# Patient Record
Sex: Female | Born: 1965 | Race: Black or African American | Hispanic: No | Marital: Married | Smoking: Never smoker
Health system: Southern US, Community
[De-identification: ages and names within clinical notes are randomized; demographics above are authoritative.]

## PROBLEM LIST (undated history)

## (undated) DIAGNOSIS — E119 Type 2 diabetes mellitus without complications: Secondary | ICD-10-CM

## (undated) DIAGNOSIS — I10 Essential (primary) hypertension: Secondary | ICD-10-CM

## (undated) DIAGNOSIS — I2699 Other pulmonary embolism without acute cor pulmonale: Secondary | ICD-10-CM

## (undated) HISTORY — DX: Essential (primary) hypertension: I10

## (undated) HISTORY — DX: Other pulmonary embolism without acute cor pulmonale: I26.99

## (undated) HISTORY — DX: Type 2 diabetes mellitus without complications: E11.9

---

## 2012-07-13 ENCOUNTER — Ambulatory Visit: Payer: No Typology Code available for payment source | Attending: Family Medicine | Admitting: Internal Medicine

## 2012-07-13 VITALS — BP 163/88 | HR 73 | Temp 97.9°F | Resp 18 | Ht 65.0 in | Wt 298.0 lb

## 2012-07-13 DIAGNOSIS — R0602 Shortness of breath: Secondary | ICD-10-CM

## 2012-07-13 DIAGNOSIS — Z7901 Long term (current) use of anticoagulants: Secondary | ICD-10-CM | POA: Insufficient documentation

## 2012-07-13 DIAGNOSIS — I10 Essential (primary) hypertension: Secondary | ICD-10-CM

## 2012-07-13 DIAGNOSIS — E119 Type 2 diabetes mellitus without complications: Secondary | ICD-10-CM

## 2012-07-13 LAB — CBC WITH DIFFERENTIAL/PLATELET
Eosinophils Relative: 2 % (ref 0–5)
HCT: 40.8 % (ref 36.0–46.0)
Hemoglobin: 13 g/dL (ref 12.0–15.0)
Lymphocytes Relative: 38 % (ref 12–46)
Lymphs Abs: 2 10*3/uL (ref 0.7–4.0)
MCV: 80.5 fL (ref 78.0–100.0)
Monocytes Relative: 7 % (ref 3–12)
Platelets: 219 10*3/uL (ref 150–400)
RBC: 5.07 MIL/uL (ref 3.87–5.11)
WBC: 5.1 10*3/uL (ref 4.0–10.5)

## 2012-07-13 LAB — COMPREHENSIVE METABOLIC PANEL
ALT: 19 U/L (ref 0–35)
CO2: 27 mEq/L (ref 19–32)
Calcium: 8.5 mg/dL (ref 8.4–10.5)
Chloride: 104 mEq/L (ref 96–112)
Creat: 0.69 mg/dL (ref 0.50–1.10)
Glucose, Bld: 101 mg/dL — ABNORMAL HIGH (ref 70–99)
Total Protein: 6.5 g/dL (ref 6.0–8.3)

## 2012-07-13 LAB — LIPID PANEL
Cholesterol: 238 mg/dL — ABNORMAL HIGH (ref 0–200)
Total CHOL/HDL Ratio: 4.9 Ratio
Triglycerides: 226 mg/dL — ABNORMAL HIGH (ref <150)

## 2012-07-13 MED ORDER — FUROSEMIDE 40 MG PO TABS: 40.0000 mg | ORAL_TABLET | Freq: Every day | ORAL | Status: DC

## 2012-07-13 MED ORDER — LOVASTATIN 40 MG PO TABS: 40.0000 mg | ORAL_TABLET | Freq: Every day | ORAL | Status: DC

## 2012-07-13 MED ORDER — WARFARIN SODIUM 2.5 MG PO TABS: 2.5000 mg | ORAL_TABLET | Freq: Every day | ORAL | Status: DC

## 2012-07-13 NOTE — Progress Notes (Signed)
Patient ID: Teresa Potts, female   DOB: September 27, 1965, 47 y.o.   MRN: [id number]  CC:  HPI: 47 year old female history of hypertension, diabetes, who presents to ER to establish care. She states that the she had pulmonary embolism following leg fracture in 2011 and 2012. She has been on Coumadin since, her last INR check was in [facility] interpreted thousand 14. The patient had a therapeutic INR at that time. She also had a CMP panel that was within normal limits. She currently describes dyspnea on exertion, she has bilateral lower extremity edema, 2+ pitting. The patient denies any recent weight gain.   Allergies  Allergen Reactions  . Adhesive (Tape) Rash   Past Medical History  Diagnosis Date  . Diabetes mellitus without complication   . Hypertension    No current outpatient prescriptions on file prior to visit.   No current facility-administered medications on file prior to visit.   History reviewed. No pertinent family history. History   Social History  . Marital Status: Married    Spouse Name: N/A    Number of Children: N/A  . Years of Education: N/A   Occupational History  . Not on file.   Social History Main Topics  . Smoking status: Never Smoker   . Smokeless tobacco: Not on file  . Alcohol Use: Yes     Comment: occasionally  . Drug Use: No  . Sexually Active: Not on file   Other Topics Concern  . Not on file   Social History Narrative  . No narrative on file    Review of Systems  Constitutional: Negative for fever, chills, diaphoresis, activity change, appetite change and fatigue.  HENT: Negative for ear pain, nosebleeds, congestion, facial swelling, rhinorrhea, neck pain, neck stiffness and ear discharge.   Eyes: Negative for pain, discharge, redness, itching and visual disturbance.  Respiratory: Negative for cough, choking, chest tightness, shortness of breath, wheezing and stridor.   Cardiovascular: Negative for chest pain, palpitations and leg  swelling.  Gastrointestinal: Negative for abdominal distention.  Genitourinary: Negative for dysuria, urgency, frequency, hematuria, flank pain, decreased urine volume, difficulty urinating and dyspareunia.  Musculoskeletal: Negative for back pain, joint swelling, arthralgias and gait problem.  Neurological: Negative for dizziness, tremors, seizures, syncope, facial asymmetry, speech difficulty, weakness, light-headedness, numbness and headaches.  Hematological: Negative for adenopathy. Does not bruise/bleed easily.  Psychiatric/Behavioral: Negative for hallucinations, behavioral problems, confusion, dysphoric mood, decreased concentration and agitation.    Objective:   Filed Vitals:   07/13/12 1049  BP: 163/88  Pulse: 73  Temp: 97.9 F (36.6 C)  Resp: 18    Physical Exam  Constitutional: Appears well-developed and well-nourished. No distress.  HENT: Normocephalic. External right and left ear normal. Oropharynx is clear and moist.  Eyes: Conjunctivae and EOM are normal. PERRLA, no scleral icterus.  Neck: Normal ROM. Neck supple. No JVD. No tracheal deviation. No thyromegaly.  CVS: RRR, S1/S2 +, no murmurs, no gallops, no carotid bruit.  Pulmonary: Effort and breath sounds normal, no stridor, rhonchi, wheezes, rales.  Abdominal: Soft. BS +,  no distension, tenderness, rebound or guarding.  Musculoskeletal: Normal range of motion. No edema and no tenderness.  Lymphadenopathy: No lymphadenopathy noted, cervical, inguinal. Neuro: Alert. Normal reflexes, muscle tone coordination. No cranial nerve deficit. Skin: Skin is warm and dry. No rash noted. Not diaphoretic. No erythema. No pallor.  Psychiatric: Normal mood and affect. Behavior, judgment, thought content normal.   No results found for this basename: WBC, HGB, HCT, MCV, PLT  No results found for this basename: CREATININE, BUN, NA, K, CL, CO2    No results found for this basename: HGBA1C   Lipid Panel  No results found for  this basename: chol, trig, hdl, cholhdl, vldl, ldlcalc       Assessment and plan:   There are no active problems to display for this patient.  History of pulmonary embolism We'll check a PT/INR Continue Coumadin at the current dose Followup in 3 weeks   Diabetes we'll check a hemoglobin A1c Continue metformin   Dyspnea on exertion Patient could have underlying diastolic heart failure given the fact that she is a diabetic we'll obtain a 2-D echo, chest x-ray, start on Lasix 40 mg a day

## 2012-07-22 ENCOUNTER — Telehealth: Payer: Self-pay | Admitting: Internal Medicine

## 2012-07-22 ENCOUNTER — Telehealth: Payer: Self-pay | Admitting: *Deleted

## 2012-07-22 DIAGNOSIS — R0602 Shortness of breath: Secondary | ICD-10-CM

## 2012-07-22 NOTE — Telephone Encounter (Signed)
07/22/12 Please review patient lab from 07/13/12 patient waiting in lobby. Thanks P.[facility] BSN [facility]

## 2012-07-22 NOTE — Telephone Encounter (Signed)
Pt was directed to get a 2D Echocardiogram without contrast. The pt went to the [facility] to be seen but was told that she needed a referral. Order for procedure is needed to continue care.

## 2012-07-22 NOTE — Telephone Encounter (Signed)
Please inform patient that labs came back okay except that his cholesterol levels were elevated.  I would recommend that he start with a low cholesterol low fat diet and exercising aggressively 3-5 times per week and to recheck the lipid panel again in about 3 months.    Rodney Langton, MD, CDE, FAAFP [facility] [place], [place]

## 2012-08-01 ENCOUNTER — Ambulatory Visit (HOSPITAL_COMMUNITY)
Admission: RE | Admit: 2012-08-01 | Discharge: 2012-08-01 | Disposition: A | Discharge status: Home / Self Care | Payer: No Typology Code available for payment source | Source: Ambulatory Visit | Attending: Internal Medicine | Admitting: Internal Medicine

## 2012-08-01 ENCOUNTER — Ambulatory Visit (HOSPITAL_COMMUNITY)
Admission: RE | Admit: 2012-08-01 | Discharge: 2012-08-01 | Disposition: A | Discharge status: Home / Self Care | Payer: Self-pay | Source: Ambulatory Visit | Attending: Family Medicine | Admitting: Family Medicine

## 2012-08-01 DIAGNOSIS — I10 Essential (primary) hypertension: Secondary | ICD-10-CM | POA: Insufficient documentation

## 2012-08-01 DIAGNOSIS — I059 Rheumatic mitral valve disease, unspecified: Secondary | ICD-10-CM | POA: Insufficient documentation

## 2012-08-01 DIAGNOSIS — R0989 Other specified symptoms and signs involving the circulatory and respiratory systems: Secondary | ICD-10-CM | POA: Insufficient documentation

## 2012-08-01 DIAGNOSIS — R0602 Shortness of breath: Secondary | ICD-10-CM | POA: Insufficient documentation

## 2012-08-01 DIAGNOSIS — E119 Type 2 diabetes mellitus without complications: Secondary | ICD-10-CM | POA: Insufficient documentation

## 2012-08-01 DIAGNOSIS — R059 Cough, unspecified: Secondary | ICD-10-CM | POA: Insufficient documentation

## 2012-08-01 DIAGNOSIS — R05 Cough: Secondary | ICD-10-CM | POA: Insufficient documentation

## 2012-08-01 NOTE — Progress Notes (Signed)
Echo Lab  2D Echocardiogram completed.  Suzanna Zahn L Kaenan Jake, RDCS 08/01/2012 10:46 AM

## 2012-08-04 ENCOUNTER — Ambulatory Visit: Payer: No Typology Code available for payment source | Attending: Family Medicine | Admitting: Internal Medicine

## 2012-08-04 VITALS — BP 136/82 | HR 106 | Temp 98.3°F | Resp 16 | Ht 65.0 in | Wt 296.2 lb

## 2012-08-04 DIAGNOSIS — I509 Heart failure, unspecified: Secondary | ICD-10-CM | POA: Insufficient documentation

## 2012-08-04 DIAGNOSIS — I5032 Chronic diastolic (congestive) heart failure: Secondary | ICD-10-CM | POA: Insufficient documentation

## 2012-08-04 DIAGNOSIS — E785 Hyperlipidemia, unspecified: Secondary | ICD-10-CM | POA: Insufficient documentation

## 2012-08-04 DIAGNOSIS — E119 Type 2 diabetes mellitus without complications: Secondary | ICD-10-CM | POA: Insufficient documentation

## 2012-08-04 DIAGNOSIS — I2782 Chronic pulmonary embolism: Secondary | ICD-10-CM

## 2012-08-04 LAB — POCT GLYCOSYLATED HEMOGLOBIN (HGB A1C): Hemoglobin A1C: 7.1

## 2012-08-04 LAB — POCT INR: INR: 2.9

## 2012-08-04 MED ORDER — FUROSEMIDE 40 MG PO TABS: 40.0000 mg | ORAL_TABLET | Freq: Every day | ORAL | Status: DC

## 2012-08-04 MED ORDER — WARFARIN SODIUM 2.5 MG PO TABS: 2.5000 mg | ORAL_TABLET | Freq: Every day | ORAL | Status: DC

## 2012-08-04 MED ORDER — LOVASTATIN 40 MG PO TABS: 40.0000 mg | ORAL_TABLET | Freq: Every day | ORAL | Status: DC

## 2012-08-04 MED ORDER — METFORMIN HCL 500 MG PO TABS: 500.0000 mg | ORAL_TABLET | Freq: Two times a day (BID) | ORAL | Status: DC

## 2012-08-04 NOTE — Progress Notes (Signed)
Patient ID: Teresa Potts, female   DOB: 13-Aug-1965, 47 y.o.   MRN: [id number] Patient Demographics  Teresa Potts, is a 47 y.o. female  WUJ:[id number]  [id number]  DOB - [birth year]  Chief Complaint  Patient presents with  . 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 She is no current subjective complaints.  Denies any subjective complaints except as above, no active headache, no chest abdominal pain at this time, not short of breath. No focal weakness which is new.   Objective:    Patient Active Problem List   Diagnosis Date Noted  . Chronic diastolic heart failure, NYHA class 1 08/04/2012  . Chronic pulmonary embolism 08/04/2012  . Long term (current) use of anticoagulants 07/13/2012     Filed Vitals:   08/04/12 1014  BP: 136/82  Pulse: 106  Temp: 98.3 F (36.8 C)  Resp: 16  Height: 5\' 5"  (1.651 m)  Weight: 296 lb 3.2 oz (134.355 kg)  SpO2: 96%     Exam Morbidly obese African American female sitting on clinic bed in no distress Awake Alert, Oriented X 3, No new F.N deficits, Normal affect [place].AT,PERRAL Supple Neck,No JVD, No cervical lymphadenopathy appriciated.  Symmetrical Chest wall movement, Good air movement bilaterally, CTAB RRR,No Gallops,Rubs or new Murmurs, No Parasternal Heave +ve B.Sounds, Abd Soft, Non tender, No organomegaly appriciated, No rebound - guarding or rigidity. No Cyanosis, Clubbing or edema, No new Rash or bruise       Data Review   CBC No results found for this basename: WBC, HGB, HCT, PLT, MCV, MCH, MCHC, RDW, NEUTRABS, LYMPHSABS, MONOABS, EOSABS, BASOSABS, BANDABS, BANDSABD,  in the last 168 hours  Chemistries   No results found for this basename: NA, K, CL, CO2, GLUCOSE, BUN, CREATININE, GFRCGP, CALCIUM, MG, AST,  ALT, ALKPHOS, BILITOT,  in the last 168 hours ------------------------------------------------------------------------------------------------------------------ No results found for this basename: HGBA1C,  in the last 72 hours ------------------------------------------------------------------------------------------------------------------ No results found for this basename: CHOL, HDL, LDLCALC, TRIG, CHOLHDL, LDLDIRECT,  in the last 72 hours ------------------------------------------------------------------------------------------------------------------ No results found for this basename: TSH, T4TOTAL, FREET3, T3FREE, THYROIDAB,  in the last 72 hours ------------------------------------------------------------------------------------------------------------------ No results found for this basename: VITAMINB12, FOLATE, FERRITIN, TIBC, IRON, RETICCTPCT,  in the last 72 hours  Coagulation profile  No results found for this basename: INR, PROTIME,  in the last 168 hours     Prior to Admission medications   Medication Sig Start Date End Date Taking? Authorizing Provider  furosemide (LASIX) 40 MG tablet Take 1 tablet (40 mg total) by mouth daily. 07/13/12   Richarda Overlie, MD  lovastatin (MEVACOR) 40 MG tablet Take 1 tablet (40 mg total) by mouth at bedtime. 07/13/12   Richarda Overlie, MD  metFORMIN (GLUCOPHAGE) 500 MG tablet Take 500 mg by mouth 2 (two) times daily with a meal.    Historical Provider, MD  warfarin (COUMADIN) 2.5 MG tablet Take 1 tablet (2.5 mg total) by mouth daily. 07/13/12   Richarda Overlie, MD     Assessment & Plan   History of DVT PE.- This was about 2 years ago, she continues to be on Coumadin, I do not see a recent INR in the system, will check an INR. She will come back in a week, will also refer her to pulmonary to see  if we can stop anticoagulation safely in this patient at this time.   Checked here currently INR came back at 2.9. And A1c at 6.1   History of diabetes  mellitus type 2. She is on Glucophage,  1C today 6.1.   History of dyslipidemia has been placed on Mevacor which he continues to take.   Chronic grade 1 diastolic CHF on echogram with preserved EF. Continue Lasix. Advised on diet and exercise.   Morbidobesity. Advised on diet and exercise.      Routine health maintenance.  Screening labs. CBC, BMP, lipid panel reviewed, she is on statin. Advised on diet exercise. A1c, TSH, INR will be checked.    Mammogram , Pap smear referred to [facility]  Immunizations - year out of tetanus shot, tetanus shot was be given next visit.      Leroy Sea M.D on 08/04/2012 at 10:24 AM

## 2012-08-04 NOTE — Patient Instructions (Signed)
Heart healthy low carbohydrate diet, do low impact aerobic exercise 30 minutes a day at least 5 times a week  Come back next week to followup on your INR, TSH and A1c results.

## 2012-08-04 NOTE — Progress Notes (Signed)
Patient here for follow up and results of recent testing

## 2012-08-11 ENCOUNTER — Ambulatory Visit (HOSPITAL_BASED_OUTPATIENT_CLINIC_OR_DEPARTMENT_OTHER)
Admission: RE | Admit: 2012-08-11 | Discharge: 2012-08-11 | Disposition: A | Discharge status: Home / Self Care | Payer: No Typology Code available for payment source | Source: Ambulatory Visit | Attending: Critical Care Medicine | Admitting: Critical Care Medicine

## 2012-08-11 ENCOUNTER — Encounter: Payer: Self-pay | Admitting: Critical Care Medicine

## 2012-08-11 ENCOUNTER — Ambulatory Visit: Payer: No Typology Code available for payment source

## 2012-08-11 ENCOUNTER — Encounter (HOSPITAL_BASED_OUTPATIENT_CLINIC_OR_DEPARTMENT_OTHER): Payer: Self-pay

## 2012-08-11 ENCOUNTER — Ambulatory Visit (HOSPITAL_BASED_OUTPATIENT_CLINIC_OR_DEPARTMENT_OTHER)
Admission: RE | Admit: 2012-08-11 | Discharge: 2012-08-11 | Disposition: A | Discharge status: Home / Self Care | Payer: No Typology Code available for payment source | Source: Ambulatory Visit | Attending: Family Medicine | Admitting: Family Medicine

## 2012-08-11 ENCOUNTER — Ambulatory Visit: Payer: No Typology Code available for payment source | Attending: Family Medicine | Admitting: Family Medicine

## 2012-08-11 ENCOUNTER — Ambulatory Visit (INDEPENDENT_AMBULATORY_CARE_PROVIDER_SITE_OTHER): Payer: No Typology Code available for payment source | Admitting: Critical Care Medicine

## 2012-08-11 VITALS — BP 128/78 | HR 86 | Temp 97.5°F | Ht 66.0 in | Wt 297.0 lb

## 2012-08-11 VITALS — BP 134/74 | HR 77 | Temp 98.3°F | Resp 16 | Wt 297.0 lb

## 2012-08-11 DIAGNOSIS — M25569 Pain in unspecified knee: Secondary | ICD-10-CM | POA: Insufficient documentation

## 2012-08-11 DIAGNOSIS — M7989 Other specified soft tissue disorders: Secondary | ICD-10-CM | POA: Insufficient documentation

## 2012-08-11 DIAGNOSIS — E119 Type 2 diabetes mellitus without complications: Secondary | ICD-10-CM | POA: Insufficient documentation

## 2012-08-11 DIAGNOSIS — I2782 Chronic pulmonary embolism: Secondary | ICD-10-CM

## 2012-08-11 DIAGNOSIS — M25561 Pain in right knee: Secondary | ICD-10-CM

## 2012-08-11 DIAGNOSIS — R0600 Dyspnea, unspecified: Secondary | ICD-10-CM

## 2012-08-11 DIAGNOSIS — R0609 Other forms of dyspnea: Secondary | ICD-10-CM

## 2012-08-11 DIAGNOSIS — M898X9 Other specified disorders of bone, unspecified site: Secondary | ICD-10-CM | POA: Insufficient documentation

## 2012-08-11 DIAGNOSIS — R079 Chest pain, unspecified: Secondary | ICD-10-CM | POA: Insufficient documentation

## 2012-08-11 DIAGNOSIS — Z7901 Long term (current) use of anticoagulants: Secondary | ICD-10-CM

## 2012-08-11 DIAGNOSIS — R0602 Shortness of breath: Secondary | ICD-10-CM | POA: Insufficient documentation

## 2012-08-11 DIAGNOSIS — I5032 Chronic diastolic (congestive) heart failure: Secondary | ICD-10-CM

## 2012-08-11 DIAGNOSIS — R222 Localized swelling, mass and lump, trunk: Secondary | ICD-10-CM | POA: Insufficient documentation

## 2012-08-11 DIAGNOSIS — I10 Essential (primary) hypertension: Secondary | ICD-10-CM | POA: Insufficient documentation

## 2012-08-11 DIAGNOSIS — Z86711 Personal history of pulmonary embolism: Secondary | ICD-10-CM | POA: Insufficient documentation

## 2012-08-11 MED ORDER — IOHEXOL 350 MG/ML SOLN
100.0000 mL | Freq: Once | INTRAVENOUS | Status: AC | PRN
  Administered 2012-08-11: 100 mL via INTRAVENOUS

## 2012-08-11 MED ORDER — ACETAMINOPHEN 500 MG PO TABS: 500.0000 mg | ORAL_TABLET | Freq: Four times a day (QID) | ORAL | Status: DC | PRN

## 2012-08-11 NOTE — Progress Notes (Signed)
Patient complains of having pain to right knee [place] from [place] pain

## 2012-08-11 NOTE — Patient Instructions (Signed)
Knee Pain  The knee is the complex joint between your thigh and your lower leg. It is made up of bones, tendons, ligaments, and cartilage. The bones that make up the knee are:   The femur in the thigh.   The tibia and fibula in the lower leg.   The patella or kneecap riding in the groove on the lower femur.  CAUSES   Knee pain is a common complaint with many causes. A few of these causes are:   Injury, such as:   A ruptured ligament or tendon injury.   Torn cartilage.   Medical conditions, such as:   Gout   Arthritis   Infections   Overuse, over training or overdoing a physical activity.  Knee pain can be minor or severe. Knee pain can accompany debilitating injury. Minor knee problems often respond well to self-care measures or get well on their own. More serious injuries may need medical intervention or even surgery.  SYMPTOMS  The knee is complex. Symptoms of knee problems can vary widely. Some of the problems are:   Pain with movement and weight bearing.   Swelling and tenderness.   Buckling of the knee.   Inability to straighten or extend your knee.   Your knee locks and you cannot straighten it.   Warmth and redness with pain and fever.   Deformity or dislocation of the kneecap.  DIAGNOSIS   Determining what is wrong may be very straight forward such as when there is an injury. It can also be challenging because of the complexity of the knee. Tests to make a diagnosis may include:   Your caregiver taking a history and doing a physical exam.   Routine X-rays can be used to rule out other problems. X-rays will not reveal a cartilage tear. Some injuries of the knee can be diagnosed by:   Arthroscopy a surgical technique by which a small video camera is inserted through tiny incisions on the sides of the knee. This procedure is used to examine and repair internal knee joint problems. Tiny instruments can be used during arthroscopy to repair the torn knee cartilage (meniscus).   Arthrography  is a radiology technique. A contrast liquid is directly injected into the knee joint. Internal structures of the knee joint then become visible on X-ray film.   An MRI scan is a non x-ray radiology procedure in which magnetic fields and a computer produce two- or three-dimensional images of the inside of the knee. Cartilage tears are often visible using an MRI scanner. MRI scans have largely replaced arthrography in diagnosing cartilage tears of the knee.   Blood work.   Examination of the fluid that helps to lubricate the knee joint (synovial fluid). This is done by taking a sample out using a needle and a syringe.  TREATMENT  The treatment of knee problems depends on the cause. Some of these treatments are:   Depending on the injury, proper casting, splinting, surgery or physical therapy care will be needed.   Give yourself adequate recovery time. Do not overuse your joints. If you begin to get sore during workout routines, back off. Slow down or do fewer repetitions.   For repetitive activities such as cycling or running, maintain your strength and nutrition.   Alternate muscle groups. For example if you are a weight lifter, work the upper body on one day and the lower body the next.   Either tight or weak muscles do not give the proper support for your   knee. Tight or weak muscles do not absorb the stress placed on the knee joint. Keep the muscles surrounding the knee strong.   Take care of mechanical problems.   If you have flat feet, orthotics or special shoes may help. See your caregiver if you need help.   Arch supports, sometimes with wedges on the inner or outer aspect of the heel, can help. These can shift pressure away from the side of the knee most bothered by osteoarthritis.   A brace called an "unloader" brace also may be used to help ease the pressure on the most arthritic side of the knee.   If your caregiver has prescribed crutches, braces, wraps or ice, use as directed. The acronym for  this is PRICE. This means protection, rest, ice, compression and elevation.   Nonsteroidal anti-inflammatory drugs (NSAID's), can help relieve pain. But if taken immediately after an injury, they may actually increase swelling. Take NSAID's with food in your stomach. Stop them if you develop stomach problems. Do not take these if you have a history of ulcers, stomach pain or bleeding from the bowel. Do not take without your caregiver's approval if you have problems with fluid retention, heart failure, or kidney problems.   For ongoing knee problems, physical therapy may be helpful.   Glucosamine and chondroitin are over-the-counter dietary supplements. Both may help relieve the pain of osteoarthritis in the knee. These medicines are different from the usual anti-inflammatory drugs. Glucosamine may decrease the rate of cartilage destruction.   Injections of a corticosteroid drug into your knee joint may help reduce the symptoms of an arthritis flare-up. They may provide pain relief that lasts a few months. You may have to wait a few months between injections. The injections do have a small increased risk of infection, water retention and elevated blood sugar levels.   Hyaluronic acid injected into damaged joints may ease pain and provide lubrication. These injections may work by reducing inflammation. A series of shots may give relief for as long as 6 months.   Topical painkillers. Applying certain ointments to your skin may help relieve the pain and stiffness of osteoarthritis. Ask your pharmacist for suggestions. Many over the-counter products are approved for temporary relief of arthritis pain.   In some countries, doctors often prescribe topical NSAID's for relief of chronic conditions such as arthritis and tendinitis. A review of treatment with NSAID creams found that they worked as well as oral medications but without the serious side effects.  PREVENTION   Maintain a healthy weight. Extra pounds put  more strain on your joints.   Get strong, stay limber. Weak muscles are a common cause of knee injuries. Stretching is important. Include flexibility exercises in your workouts.   Be smart about exercise. If you have osteoarthritis, chronic knee pain or recurring injuries, you may need to change the way you exercise. This does not mean you have to stop being active. If your knees ache after jogging or playing basketball, consider switching to swimming, water aerobics or other low-impact activities, at least for a few days a week. Sometimes limiting high-impact activities will provide relief.   Make sure your shoes fit well. Choose footwear that is right for your sport.   Protect your knees. Use the proper gear for knee-sensitive activities. Use kneepads when playing volleyball or laying carpet. Buckle your seat belt every time you drive. Most shattered kneecaps occur in car accidents.   Rest when you are tired.  SEEK MEDICAL CARE IF:     You have knee pain that is continual and does not seem to be getting better.   SEEK IMMEDIATE MEDICAL CARE IF:   Your knee joint feels hot to the touch and you have a high fever.  MAKE SURE YOU:    Understand these instructions.   Will watch your condition.   Will get help right away if you are not doing well or get worse.  Document Released: 11/09/2006 Document Revised: 04/06/2011 Document Reviewed: 11/09/2006  [employer] Patient Information 2014 [employer]

## 2012-08-11 NOTE — Progress Notes (Signed)
Subjective:    Patient ID: Teresa Potts, female    DOB: 1965/06/20, 47 y.o.   MRN: [id number]  HPI Comments: Referred for pulmonary embolism evaluation.  Hx of PE two years ago, in [place], then tfr to [place] in [place] Pt now on coumadin.     Shortness of Breath This is a chronic problem. The current episode started more than 1 year ago. The problem occurs daily (Pt notes dyspnea with exertion, and lay flat). The problem has been gradually improving (lasix has helped the dyspnea). Associated symptoms include abdominal pain, chest pain, headaches, leg swelling, orthopnea, PND and wheezing. Pertinent negatives include no claudication, coryza, ear pain, fever, hemoptysis, leg pain, neck pain, rash, rhinorrhea, sore throat, sputum production, swollen glands, syncope or vomiting. Her past medical history is significant for allergies, DVT and PE. There is no history of aspirin allergies, CAD, COPD, a heart failure, pneumonia or a recent surgery.    Records from [place]:  08/2009 adm for PE.  D/c on coumadin.  Had DVT in L femoral vein and massive PE   Echo had pulm HTN Hypercoagulable assay normla  Past Medical History  Diagnosis Date  . Diabetes mellitus without complication   . Hypertension   . Pulmonary embolism      No family history on file.   History   Social History  . Marital Status: Married    Spouse Name: N/A    Number of Children: 3  . Years of Education: N/A   Occupational History  . Not on file.   Social History Main Topics  . Smoking status: Never Smoker   . Smokeless tobacco: Never Used  . Alcohol Use: Yes     Comment: occasionally  . Drug Use: No  . Sexually Active: Not on file   Other Topics Concern  . Not on file   Social History Narrative  . No narrative on file     Allergies  Allergen Reactions  . Adhesive (Tape) Rash     Outpatient Prescriptions Prior to Visit  Medication Sig Dispense Refill  . furosemide (LASIX) 40 MG tablet Take 1 tablet  (40 mg total) by mouth daily.  30 tablet  3  . lovastatin (MEVACOR) 40 MG tablet Take 1 tablet (40 mg total) by mouth at bedtime.  30 tablet  3  . metFORMIN (GLUCOPHAGE) 500 MG tablet Take 1 tablet (500 mg total) by mouth 2 (two) times daily with a meal.  60 tablet  3  . warfarin (COUMADIN) 2.5 MG tablet Take 1 tablet (2.5 mg total) by mouth daily.  30 tablet  0  . acetaminophen (TYLENOL) 500 MG tablet Take 1 tablet (500 mg total) by mouth every 6 (six) hours as needed for pain.  30 tablet  2   No facility-administered medications prior to visit.     Review of Systems  Constitutional: Positive for fatigue and unexpected weight change. Negative for fever, chills, diaphoresis, activity change and appetite change.  HENT: Positive for congestion. Negative for hearing loss, ear pain, nosebleeds, sore throat, facial swelling, rhinorrhea, sneezing, mouth sores, trouble swallowing, neck pain, neck stiffness, dental problem, voice change, postnasal drip, sinus pressure, tinnitus and ear discharge.   Eyes: Negative for photophobia, discharge, itching and visual disturbance.  Respiratory: Positive for cough, chest tightness, shortness of breath and wheezing. Negative for apnea, hemoptysis, sputum production, choking and stridor.   Cardiovascular: Positive for chest pain, palpitations, orthopnea, leg swelling and PND. Negative for claudication and syncope.  Gastrointestinal: Positive for  abdominal pain. Negative for nausea, vomiting, constipation, blood in stool and abdominal distention.  Genitourinary: Negative for dysuria, urgency, frequency, hematuria, flank pain, decreased urine volume and difficulty urinating.  Musculoskeletal: Negative for myalgias, back pain, joint swelling, arthralgias and gait problem.  Skin: Negative for color change, pallor and rash.  Neurological: Positive for dizziness, weakness, numbness and headaches. Negative for tremors, seizures, syncope, speech difficulty and  light-headedness.  Hematological: Negative for adenopathy. Bruises/bleeds easily.  Psychiatric/Behavioral: Negative for confusion, sleep disturbance and agitation. The patient is not nervous/anxious.        Objective:   Physical Exam Filed Vitals:   08/11/12 1354  BP: 128/78  Pulse: 86  Temp: 97.5 F (36.4 C)  TempSrc: Oral  Height: 5\' 6"  (1.676 m)  Weight: 297 lb (134.718 kg)  SpO2: 96%    Gen: Pleasant, well-nourished, in no distress,  normal affect  ENT: No lesions,  mouth clear,  oropharynx clear, no postnasal drip  Neck: No JVD, no TMG, no carotid bruits  Lungs: No use of accessory muscles, no dullness to percussion, clear without rales or rhonchi  Cardiovascular: RRR, heart sounds normal, no murmur or gallops, 2+  peripheral edema, varicose veins  Abdomen: soft and NT, no HSM,  BS normal  Musculoskeletal: No deformities, no cyanosis or clubbing  Neuro: alert, non focal  Skin: Warm, no lesions or rashes  Ct Angio Chest Pe W/cm &/or Wo Cm  08/11/2012   *RADIOLOGY REPORT*  Clinical Data: Shortness of breath, chest pain, bilateral lower extremity swelling, history of pulmonary embolism, diabetes, hypertension, evaluate for pulmonary embolism  CT ANGIOGRAPHY CHEST  Technique:  Multidetector CT imaging of the chest using the standard protocol during bolus administration of intravenous contrast. Multiplanar reconstructed images including MIPs were obtained and reviewed to evaluate the vascular anatomy.  Contrast: OMNIPAQUE IOHEXOL 350 MG/ML SOLN  Comparison: Chest radiograph of 08/01/2012  Vascular Findings:  There is adequate opacification of the pulmonary arterial system of the main pulmonary artery measuring 246 HU.  There are no discrete filling defects within the pulmonary arterial tree to suggest pulmonary embolism.  Normal caliber of the main pulmonary artery.  Borderline cardiomegaly.  No pericardial effusion.  Normal caliber of the thoracic aorta.  Bovine  configuration of the aortic arch is incidentally noted.  The visualized portions of the major branch vessels of the aortic arch widely patent.  No definite periaortic stranding.  ------------------------------------------------------------------- --  Nonvascular findings:  No focal airspace opacities.  No pleural effusion or pneumothorax. No discrete pulmonary nodules.  The central pulmonary airways are widely patent.  No mediastinal, hilar or axillary lymphadenopathy.  The early arterial phase evaluation the superior aspect of the abdomen demonstrates an approximately 3.5 x 2.7 cm hypoattenuating lesion within the imaged posterior aspect of the dome of the right lobe of the liver (image 87, series five).  While this lesion is incompletely imaged it demonstrates minimal peripheral nodular enhancement (image 87) and may be indicative of a hepatic hemangioma.  No acute or aggressive osseous abnormalities.  Mild heterogeneity of the thyroid gland.  IMPRESSION:  1. No acute cardiopulmonary disease.  Specifically, no evidence of acute or chronic pulmonary embolism.  2.  Incompletely imaged approximately 3.5 cm mass within the posterior aspect of the dome of the right lobe of the liver which may demonstrate peripheral nodular enhancement suggestive of hepatic hemangioma.  Further evaluation with non emergent abdominal MRI is recommended.   Original Report Authenticated By: Tacey Ruiz, MD   Dg  Knee Complete 4 Views Right  08/11/2012   *RADIOLOGY REPORT*  Clinical Data: Knee pain with no known injury  RIGHT KNEE - COMPLETE 4+ VIEW  Comparison: None.  Findings: There is tricompartment narrowing identified with osteophytosis identified associated with the lateral aspect of the tibia, the inferior aspect of the medial femoral condyle and associated with patella femoral compartment.  Spurring of the tibial spines is seen.  No abnormal calcification is seen around the knee joint to suggest bursal or tendinous calcification  or chondrocalcinosis.  No focal soft tissue abnormality is seen.  No worrisome focal or acute bony abnormalities  IMPRESSION: Degenerative change around the knee as described above   Original Report Authenticated By: Rhodia Albright, M.D.          Assessment & Plan:   Dyspnea Concern raised re possibility of chronic venous thrombembolic disease.  Note Recent Echo normal, no pulm HTN. Pt does have varicose veins. ?chronic DVT.   Repeat CT angio reasonable Pt does have dyspnea ? Airway disease Plan  Note CT angio done day of visit >>>NORMAL, no PE chronic, no acute process Venous doppler  PFT ordered    Updated Medication List Outpatient Encounter Prescriptions as of 08/11/2012  Medication Sig Dispense Refill  . furosemide (LASIX) 40 MG tablet Take 1 tablet (40 mg total) by mouth daily.  30 tablet  3  . lovastatin (MEVACOR) 40 MG tablet Take 1 tablet (40 mg total) by mouth at bedtime.  30 tablet  3  . metFORMIN (GLUCOPHAGE) 500 MG tablet Take 1 tablet (500 mg total) by mouth 2 (two) times daily with a meal.  60 tablet  3  . omeprazole (PRILOSEC) 40 MG capsule Take 40 mg by mouth at bedtime.      Marland Kitchen warfarin (COUMADIN) 2.5 MG tablet Take 1 tablet (2.5 mg total) by mouth daily.  30 tablet  0  . acetaminophen (TYLENOL) 500 MG tablet Take 1 tablet (500 mg total) by mouth every 6 (six) hours as needed for pain.  30 tablet  2   No facility-administered encounter medications on file as of 08/11/2012.

## 2012-08-11 NOTE — Progress Notes (Signed)
Patient ID: Teresa Potts, female   DOB: 1965-03-16, 47 y.o.   MRN: [id number]  CC:  Follow up   HPI: Pt reporting that she is having right knee pain and discomfort.  This has been an ongoing problem for quite a long time.  Pt is taking diclofenac but it is causing stomach upset.    Allergies  Allergen Reactions  . Adhesive (Tape) Rash   Past Medical History  Diagnosis Date  . Diabetes mellitus without complication   . Hypertension    Current Outpatient Prescriptions on File Prior to Visit  Medication Sig Dispense Refill  . furosemide (LASIX) 40 MG tablet Take 1 tablet (40 mg total) by mouth daily.  30 tablet  3  . lovastatin (MEVACOR) 40 MG tablet Take 1 tablet (40 mg total) by mouth at bedtime.  30 tablet  3  . metFORMIN (GLUCOPHAGE) 500 MG tablet Take 1 tablet (500 mg total) by mouth 2 (two) times daily with a meal.  60 tablet  3  . warfarin (COUMADIN) 2.5 MG tablet Take 1 tablet (2.5 mg total) by mouth daily.  30 tablet  0   No current facility-administered medications on file prior to visit.   History reviewed. No pertinent family history. History   Social History  . Marital Status: Married    Spouse Name: N/A    Number of Children: N/A  . Years of Education: N/A   Occupational History  . Not on file.   Social History Main Topics  . Smoking status: Never Smoker   . Smokeless tobacco: Not on file  . Alcohol Use: Yes     Comment: occasionally  . Drug Use: No  . Sexually Active: Not on file   Other Topics Concern  . Not on file   Social History Narrative  . No narrative on file    Review of Systems  Constitutional: Negative for fever, chills, diaphoresis, activity change, appetite change and fatigue.  HENT: Negative for ear pain, nosebleeds, congestion, facial swelling, rhinorrhea, neck pain, neck stiffness and ear discharge.   Eyes: Negative for pain, discharge, redness, itching and visual disturbance.  Respiratory: Negative for cough, choking, chest  tightness, shortness of breath, wheezing and stridor.   Cardiovascular: Negative for chest pain, palpitations and leg swelling.  Gastrointestinal: Negative for abdominal distention.  Genitourinary: Negative for dysuria, urgency, frequency, hematuria, flank pain, decreased urine volume, difficulty urinating and dyspareunia.  Musculoskeletal: right knee pain.  Neurological: Negative for dizziness, tremors, seizures, syncope, facial asymmetry, speech difficulty, weakness, light-headedness, numbness and headaches.  Hematological: Negative for adenopathy. Does not bruise/bleed easily.  Psychiatric/Behavioral: Negative for hallucinations, behavioral problems, confusion, dysphoric mood, decreased concentration and agitation.    Objective:   Filed Vitals:   08/11/12 1053  BP: 134/74  Pulse: 77  Temp: 98.3 F (36.8 C)  Resp: 16    Physical Exam  Constitutional: Appears well-developed and well-nourished. No distress.  HENT: Normocephalic. External right and left ear normal. Oropharynx is clear and moist.  Eyes: Conjunctivae and EOM are normal. PERRLA, no scleral icterus.  Neck: Normal ROM. Neck supple. No JVD. No tracheal deviation. No thyromegaly.  CVS: RRR, S1/S2 +, no murmurs, no gallops, no carotid bruit.  Pulmonary: Effort and breath sounds normal, no stridor, rhonchi, wheezes, rales.  Abdominal: Soft. BS +,  no distension, tenderness, rebound or guarding.  Musculoskeletal: 2+ edema bilateral LEs.  Lymphadenopathy: No lymphadenopathy noted, cervical, inguinal. Neuro: Alert. Normal reflexes, muscle tone coordination. No cranial nerve deficit. Skin: Skin is warm  and dry. No rash noted. Not diaphoretic. No erythema. No pallor.  Psychiatric: Normal mood and affect. Behavior, judgment, thought content normal.   Lab Results  Component Value Date   WBC 5.1 07/13/2012   HGB 13.0 07/13/2012   HCT 40.8 07/13/2012   MCV 80.5 07/13/2012   PLT 219 07/13/2012   Lab Results  Component Value Date    CREATININE 0.69 07/13/2012   BUN 13 07/13/2012   NA 137 07/13/2012   K 4.4 07/13/2012   CL 104 07/13/2012   CO2 27 07/13/2012    Lab Results  Component Value Date   HGBA1C 7.1 08/04/2012   Lipid Panel     Component Value Date/Time   CHOL 238* 07/13/2012 1128   TRIG 226* 07/13/2012 1128   HDL 49 07/13/2012 1128   CHOLHDL 4.9 07/13/2012 1128   VLDL 45* 07/13/2012 1128   LDLCALC 144* 07/13/2012 1128       Assessment and plan:   Patient Active Problem List   Diagnosis Date Noted  . Right knee pain 08/11/2012  . Chronic diastolic heart failure, NYHA class 1 08/04/2012  . Chronic pulmonary embolism 08/04/2012  . Long term (current) use of anticoagulants 07/13/2012    Sports medicine referral  Tylenol extra strength for pain of knee DC diclofenac Check PT INR today   Follow up in 1 months  The patient was given clear instructions to go to ER or return to medical center if symptoms don't improve, worsen or new problems develop.  The patient verbalized understanding.  The patient was told to call to get any lab results if not heard anything in the next week.    Rodney Langton, MD, CDE, FAAFP [facility] [place], [place]   Results for orders placed in visit on 08/11/12  POCT INR      Result Value Range   INR 2.3

## 2012-08-11 NOTE — Patient Instructions (Addendum)
A breathing test will be obtained A CT Chest will be obtained A venous doppler ultrasound will be obtained of the legs No change in medicaitons Return 1 month

## 2012-08-12 ENCOUNTER — Encounter (INDEPENDENT_AMBULATORY_CARE_PROVIDER_SITE_OTHER): Payer: No Typology Code available for payment source

## 2012-08-12 ENCOUNTER — Telehealth: Payer: Self-pay | Admitting: *Deleted

## 2012-08-12 DIAGNOSIS — I2782 Chronic pulmonary embolism: Secondary | ICD-10-CM

## 2012-08-12 NOTE — Progress Notes (Signed)
Quick Note:  Per lady answering phone, Teresa Potts is currently at work. ______

## 2012-08-12 NOTE — Progress Notes (Signed)
Quick Note:  Notify the patient that the CT Chest is NORMAL, No blood clots in her system. No change in medications are recommended. Continue current meds as prescribed at last office visit ______

## 2012-08-12 NOTE — Telephone Encounter (Signed)
08/12/12 Patient  Not available message left via telephone that right knee X-Ray reveals  Degenerative changes per Dr. Laural Benes. P.Eliyana Pagliaro,RN BSN [facility]

## 2012-08-12 NOTE — Progress Notes (Signed)
Quick Note:  Please inform patient that right knee xray reveals degenerative changes.   Rodney Langton, MD, CDE, FAAFP [facility] [place], [place]   ______

## 2012-08-12 NOTE — Progress Notes (Signed)
Quick Note:  Called # provided in pt's chart. Was asked to leave msg for pt to call back. Note: pt doesn't speak [place]. Teresa Potts may call back. ______

## 2012-08-12 NOTE — Assessment & Plan Note (Signed)
Concern raised re possibility of chronic venous thrombembolic disease.  Note Recent Echo normal, no pulm HTN. Pt does have varicose veins. ?chronic DVT.   Repeat CT angio reasonable Pt does have dyspnea ? Airway disease Plan  Note CT angio done day of visit >>>NORMAL, no PE chronic, no acute process Venous doppler  PFT ordered

## 2012-08-15 ENCOUNTER — Telehealth: Payer: Self-pay | Admitting: Critical Care Medicine

## 2012-08-15 ENCOUNTER — Encounter (HOSPITAL_COMMUNITY): Payer: No Typology Code available for payment source

## 2012-08-15 NOTE — Telephone Encounter (Signed)
Result Note    Call pt and tell her venous doppler u/s NORMAL, NO blood clots in her system.   Awaiting breathing testing   Result Note    Notify the patient that the CT Chest is NORMAL, No blood clots in her system. No change in medications are recommended.   Continue current meds as prescribed at last office visit   -----  Called # provided above.  Spoke with Antony Salmon.  Was advised that she could relay results to pt.  Informed her of above results and recs per Dr. Delford Field.  She will relay this to pt.  Carney Bern voiced no further questions or concerns at this time.

## 2012-08-15 NOTE — Progress Notes (Signed)
Quick Note:  Called, spoke with pt who handed her son the phone as she doesn't speak [place]. He states Danelle Earthly is at work and requesting to have someone call back who can relay results to pt. ______

## 2012-08-15 NOTE — Progress Notes (Signed)
Quick Note:  Spoke with Antony Salmon, INformed her of results and recs per Dr. Delford Field. She verbalized understanding and will inform pt. ______

## 2012-08-15 NOTE — Progress Notes (Signed)
Quick Note:  Called, spoke with pt who gave her son the phone. States Danelle Earthly is at work. He will have someone call office back to relay results to pt. ______

## 2012-08-15 NOTE — Progress Notes (Signed)
Quick Note:  Called, spoke with Antony Salmon. Informed her of results and recs per Dr. Delford Field. She verbalized understanding and will inform pt. ______

## 2012-08-17 ENCOUNTER — Encounter (HOSPITAL_COMMUNITY): Payer: No Typology Code available for payment source

## 2012-08-24 ENCOUNTER — Ambulatory Visit (HOSPITAL_COMMUNITY)
Admission: RE | Admit: 2012-08-24 | Discharge: 2012-08-24 | Disposition: A | Discharge status: Home / Self Care | Payer: No Typology Code available for payment source | Source: Ambulatory Visit | Attending: Critical Care Medicine | Admitting: Critical Care Medicine

## 2012-08-24 DIAGNOSIS — I2782 Chronic pulmonary embolism: Secondary | ICD-10-CM

## 2012-08-24 MED ORDER — ALBUTEROL SULFATE (5 MG/ML) 0.5% IN NEBU
2.5000 mg | INHALATION_SOLUTION | Freq: Once | RESPIRATORY_TRACT | Status: AC
  Administered 2012-08-24: 2.5 mg via RESPIRATORY_TRACT

## 2012-08-30 ENCOUNTER — Ambulatory Visit: Payer: No Typology Code available for payment source | Attending: Family Medicine | Admitting: Internal Medicine

## 2012-08-30 VITALS — BP 133/81 | HR 79 | Temp 97.7°F | Resp 17 | Wt 297.2 lb

## 2012-08-30 DIAGNOSIS — E1110 Type 2 diabetes mellitus with ketoacidosis without coma: Secondary | ICD-10-CM

## 2012-08-30 DIAGNOSIS — M1710 Unilateral primary osteoarthritis, unspecified knee: Secondary | ICD-10-CM

## 2012-08-30 DIAGNOSIS — M1711 Unilateral primary osteoarthritis, right knee: Secondary | ICD-10-CM

## 2012-08-30 DIAGNOSIS — Z5181 Encounter for therapeutic drug level monitoring: Secondary | ICD-10-CM

## 2012-08-30 DIAGNOSIS — Z7901 Long term (current) use of anticoagulants: Secondary | ICD-10-CM

## 2012-08-30 DIAGNOSIS — E1310 Other specified diabetes mellitus with ketoacidosis without coma: Secondary | ICD-10-CM

## 2012-08-30 DIAGNOSIS — E119 Type 2 diabetes mellitus without complications: Secondary | ICD-10-CM | POA: Insufficient documentation

## 2012-08-30 LAB — POCT GLYCOSYLATED HEMOGLOBIN (HGB A1C): Hemoglobin A1C: 6.6

## 2012-08-30 MED ORDER — OMEPRAZOLE 40 MG PO CPDR: 40.0000 mg | DELAYED_RELEASE_CAPSULE | Freq: Every day | ORAL | Status: DC

## 2012-08-30 MED ORDER — WARFARIN SODIUM 5 MG PO TABS: 5.0000 mg | ORAL_TABLET | Freq: Every day | ORAL | Status: DC

## 2012-08-30 MED ORDER — IBUPROFEN 600 MG PO TABS: 600.0000 mg | ORAL_TABLET | Freq: Four times a day (QID) | ORAL | Status: AC | PRN

## 2012-08-30 NOTE — Progress Notes (Signed)
Patient ID: Teresa Potts, female   DOB: 01-02-1966, 47 y.o.   MRN: [id number]  CC: Followup  HPI: Patient returned to clinic today for followup of INR and medication refill. She is on Coumadin 5 mg on Monday, Tuesday, Wednesday, and Thursday, and 7.5 mg Friday, Saturday, Sunday. She said this has been had dosage regimen for a long time. No history of bleeding.  She still has right knee pain, which is chronic. She had an x-ray done recently, showed osteoarthritis features. She has no swelling, no history of trauma. She is compliant with other medications.  Allergies  Allergen Reactions  . Adhesive (Tape) Rash   Past Medical History  Diagnosis Date  . Diabetes mellitus without complication   . Hypertension   . Pulmonary embolism    Current Outpatient Prescriptions on File Prior to Visit  Medication Sig Dispense Refill  . acetaminophen (TYLENOL) 500 MG tablet Take 1 tablet (500 mg total) by mouth every 6 (six) hours as needed for pain.  30 tablet  2  . furosemide (LASIX) 40 MG tablet Take 1 tablet (40 mg total) by mouth daily.  30 tablet  3  . lovastatin (MEVACOR) 40 MG tablet Take 1 tablet (40 mg total) by mouth at bedtime.  30 tablet  3  . metFORMIN (GLUCOPHAGE) 500 MG tablet Take 1 tablet (500 mg total) by mouth 2 (two) times daily with a meal.  60 tablet  3  . warfarin (COUMADIN) 2.5 MG tablet Take 1 tablet (2.5 mg total) by mouth daily.  30 tablet  0   No current facility-administered medications on file prior to visit.   History reviewed. No pertinent family history. History   Social History  . Marital Status: Married    Spouse Name: N/A    Number of Children: 3  . Years of Education: N/A   Occupational History  . Not on file.   Social History Main Topics  . Smoking status: Never Smoker   . Smokeless tobacco: Never Used  . Alcohol Use: Yes     Comment: occasionally  . Drug Use: No  . Sexually Active: Not on file   Other Topics Concern  . Not on file   Social  History Narrative  . No narrative on file    Review of Systems: Constitutional: Negative for fever, chills, diaphoresis, activity change, appetite change and fatigue. HENT: Negative for ear pain, nosebleeds, congestion, facial swelling, rhinorrhea, neck pain, neck stiffness and ear discharge.  Eyes: Negative for pain, discharge, redness, itching and visual disturbance. Respiratory: Negative for cough, choking, chest tightness, shortness of breath, wheezing and stridor.  Cardiovascular: Negative for chest pain, palpitations and leg swelling. Gastrointestinal: Negative for abdominal distention. Genitourinary: Negative for dysuria, urgency, frequency, hematuria, flank pain, decreased urine volume, difficulty urinating and dyspareunia.  Musculoskeletal: Negative for back pain, joint swelling Neurological: Negative for dizziness, tremors, seizures, syncope, facial asymmetry, speech difficulty, weakness, light-headedness, numbness and headaches.  Hematological: Negative for adenopathy. Does not bruise/bleed easily. Psychiatric/Behavioral: Negative for hallucinations, behavioral problems, confusion, dysphoric mood, decreased concentration and agitation.    Objective:   Filed Vitals:   08/30/12 1103  BP: 133/81  Pulse: 79  Temp: 97.7 F (36.5 C)  Resp: 17    Physical Exam: Constitutional: Patient appears well-developed and well-nourished. No distress. HENT: Normocephalic, atraumatic, External right and left ear normal. Oropharynx is clear and moist.  Eyes: Conjunctivae and EOM are normal. PERRLA, no scleral icterus. Neck: Normal ROM. Neck supple. No JVD. No tracheal deviation. No  thyromegaly. CVS: RRR, S1/S2 +, no murmurs, no gallops, no carotid bruit.  Pulmonary: Effort and breath sounds normal, no stridor, rhonchi, wheezes, rales.  Abdominal: Soft. BS +,  no distension, tenderness, rebound or guarding.  Musculoskeletal: Normal range of motion. No edema and no tenderness.   Lymphadenopathy: No lymphadenopathy noted, cervical, inguinal or axillary Neuro: Alert. Normal reflexes, muscle tone coordination. No cranial nerve deficit. Skin: Skin is warm and dry. No rash noted. Not diaphoretic. No erythema. No pallor. Psychiatric: Normal mood and affect. Behavior, judgment, thought content normal.  Lab Results  Component Value Date   WBC 5.1 07/13/2012   HGB 13.0 07/13/2012   HCT 40.8 07/13/2012   MCV 80.5 07/13/2012   PLT 219 07/13/2012   Lab Results  Component Value Date   CREATININE 0.69 07/13/2012   BUN 13 07/13/2012   NA 137 07/13/2012   K 4.4 07/13/2012   CL 104 07/13/2012   CO2 27 07/13/2012    Lab Results  Component Value Date   HGBA1C 7.1 08/04/2012   Lipid Panel     Component Value Date/Time   CHOL 238* 07/13/2012 1128   TRIG 226* 07/13/2012 1128   HDL 49 07/13/2012 1128   CHOLHDL 4.9 07/13/2012 1128   VLDL 45* 07/13/2012 1128   LDLCALC 144* 07/13/2012 1128       Assessment and plan:   Patient Active Problem List   Diagnosis Date Noted  . Osteoarthritis of right knee 08/30/2012  . Diabetes 08/30/2012  . Encounter for monitoring coumadin therapy 08/30/2012  . Right knee pain 08/11/2012  . Chronic diastolic heart failure, NYHA class 1 08/04/2012  . Dyspnea 08/04/2012  . Long term (current) use of anticoagulants 07/13/2012   INR today is 3.3 Hemoglobin A1c today is 6.6  Plan:  Hold Coumadin today, restart from tomorrow at 5 mg daily by mouth Continue metformin at the current dose Patient was given a prescription for ibuprofen 600 mg every 6 hr when necessary for osteoarthritis of the right knee Patient was extensively counseled on weight reduction, diet and exercise  Patient was instructed of the possibility of bleeding with Coumadin and ibuprofen, to report to ER immediately if bleeding is noticed at anytime Patient may need orthopedic referral down the road for possible right knee replacement  Hansika Paolucci was given clear  instructions to go to ER or return to the clinic if symptoms don't improve, worsen or new problems develop.  Amijah Vasek verbalized understanding.  Analiz Tvedt was told to call to get lab results if hasn't heard anything in the next week.        Jeanann Lewandowsky, MD [facility] [place], [place] [phone]   08/30/2012, 11:58 AM

## 2012-08-30 NOTE — Progress Notes (Signed)
Patient here for follow up Had x ray done

## 2012-09-12 ENCOUNTER — Encounter: Payer: Self-pay | Admitting: Critical Care Medicine

## 2012-09-12 ENCOUNTER — Ambulatory Visit (INDEPENDENT_AMBULATORY_CARE_PROVIDER_SITE_OTHER): Payer: No Typology Code available for payment source | Admitting: Critical Care Medicine

## 2012-09-12 VITALS — BP 124/76 | HR 68 | Temp 97.7°F | Ht 64.0 in | Wt 292.0 lb

## 2012-09-12 DIAGNOSIS — R0600 Dyspnea, unspecified: Secondary | ICD-10-CM

## 2012-09-12 DIAGNOSIS — R0609 Other forms of dyspnea: Secondary | ICD-10-CM

## 2012-09-12 NOTE — Patient Instructions (Signed)
No active lung issues I will discuss with primary care about stopping coumadin Return as needed

## 2012-09-12 NOTE — Progress Notes (Signed)
Subjective:    Patient ID: Teresa Potts, female    DOB: 12-30-1965, 47 y.o.   MRN: [id number]  HPI Comments: Referred for pulmonary embolism evaluation.  Hx of PE two years ago, in [place], then tfr to [place] in [place] Pt now on coumadin.     Shortness of Breath This is a chronic problem. The current episode started more than 1 year ago. The problem occurs daily (Pt notes dyspnea with exertion, and lay flat). The problem has been gradually improving (lasix has helped the dyspnea). Associated symptoms include abdominal pain, chest pain, headaches, leg swelling, orthopnea, PND and wheezing. Pertinent negatives include no claudication, coryza, ear pain, fever, hemoptysis, leg pain, neck pain, rash, rhinorrhea, sore throat, sputum production, swollen glands, syncope or vomiting. Her past medical history is significant for allergies, DVT and PE. There is no history of aspirin allergies, CAD, COPD, a heart failure, pneumonia or a recent surgery.    Records from [place]:  08/2009 adm for PE.  D/c on coumadin.  Had DVT in L femoral vein and massive PE   Echo had pulm HTN Hypercoagulable assay normla   09/12/2012 Chief Complaint  Patient presents with  . Follow-up    W/ PFT results. She stated her breathing is getting better. Pt reports when it is wendy she has more of cough. Denies any wheezing, no chest tx, no CP. Per also reports her leg swelling has been better for the past couple of days   Dyspnea Concern raised re possibility of chronic venous thrombembolic disease.  Note Recent Echo normal, no pulm HTN. Pt does have varicose veins. ?chronic DVT.   Repeat CT angio reasonable Pt does have dyspnea ? Airway disease Plan  Note CT angio done day of visit >>>NORMAL, no PE chronic, no acute process Venous doppler >>neg PFT ordered >>>normal     Past Medical History  Diagnosis Date  . Diabetes mellitus without complication   . Hypertension   . Pulmonary embolism      No family  history on file.   History   Social History  . Marital Status: Married    Spouse Name: N/A    Number of Children: 3  . Years of Education: N/A   Occupational History  . Not on file.   Social History Main Topics  . Smoking status: Never Smoker   . Smokeless tobacco: Never Used  . Alcohol Use: Yes     Comment: occasionally  . Drug Use: No  . Sexual Activity: Not on file   Other Topics Concern  . Not on file   Social History Narrative  . No narrative on file     Allergies  Allergen Reactions  . Adhesive [Tape] Rash     Outpatient Prescriptions Prior to Visit  Medication Sig Dispense Refill  . furosemide (LASIX) 40 MG tablet Take 1 tablet (40 mg total) by mouth daily.  30 tablet  3  . ibuprofen (ADVIL,MOTRIN) 600 MG tablet Take 1 tablet (600 mg total) by mouth every 6 (six) hours as needed for pain.  30 tablet  1  . lovastatin (MEVACOR) 40 MG tablet Take 1 tablet (40 mg total) by mouth at bedtime.  30 tablet  3  . metFORMIN (GLUCOPHAGE) 500 MG tablet Take 1 tablet (500 mg total) by mouth 2 (two) times daily with a meal.  60 tablet  3  . omeprazole (PRILOSEC) 40 MG capsule Take 1 capsule (40 mg total) by mouth at bedtime.  60 capsule  1  .  warfarin (COUMADIN) 5 MG tablet Take 1 tablet (5 mg total) by mouth daily.  30 tablet  1  . acetaminophen (TYLENOL) 500 MG tablet Take 1 tablet (500 mg total) by mouth every 6 (six) hours as needed for pain.  30 tablet  2   No facility-administered medications prior to visit.     Review of Systems  Constitutional: Positive for fatigue and unexpected weight change. Negative for fever, chills, diaphoresis, activity change and appetite change.  HENT: Positive for congestion. Negative for hearing loss, ear pain, nosebleeds, sore throat, facial swelling, rhinorrhea, sneezing, mouth sores, trouble swallowing, neck pain, neck stiffness, dental problem, voice change, postnasal drip, sinus pressure, tinnitus and ear discharge.   Eyes: Negative  for photophobia, discharge, itching and visual disturbance.  Respiratory: Positive for cough, chest tightness, shortness of breath and wheezing. Negative for apnea, hemoptysis, sputum production, choking and stridor.   Cardiovascular: Positive for chest pain, palpitations, orthopnea, leg swelling and PND. Negative for claudication and syncope.  Gastrointestinal: Positive for abdominal pain. Negative for nausea, vomiting, constipation, blood in stool and abdominal distention.  Genitourinary: Negative for dysuria, urgency, frequency, hematuria, flank pain, decreased urine volume and difficulty urinating.  Musculoskeletal: Negative for myalgias, back pain, joint swelling, arthralgias and gait problem.  Skin: Negative for color change, pallor and rash.  Neurological: Positive for dizziness, weakness, numbness and headaches. Negative for tremors, seizures, syncope, speech difficulty and light-headedness.  Hematological: Negative for adenopathy. Bruises/bleeds easily.  Psychiatric/Behavioral: Negative for confusion, sleep disturbance and agitation. The patient is not nervous/anxious.        Objective:   Physical Exam  Filed Vitals:   09/12/12 1000 09/12/12 1003  BP:  124/76  Pulse:  68  Temp: 97.7 F (36.5 C)   TempSrc: Oral   Height: 5\' 4"  (1.626 m)   Weight: 292 lb (132.45 kg)   SpO2:  97%    Gen: Pleasant, well-nourished, in no distress,  normal affect  ENT: No lesions,  mouth clear,  oropharynx clear, no postnasal drip  Neck: No JVD, no TMG, no carotid bruits  Lungs: No use of accessory muscles, no dullness to percussion, clear without rales or rhonchi  Cardiovascular: RRR, heart sounds normal, no murmur or gallops, 2+  peripheral edema, varicose veins  Abdomen: soft and NT, no HSM,  BS normal  Musculoskeletal: No deformities, no cyanosis or clubbing  Neuro: alert, non focal  Skin: Warm, no lesions or rashes  No results found. 08/12/2012 CT angio chest: normal, No  PE 07/2012: Echo: Normal  Pulmonary function study 09/08/2012: FEV1 100% FVC 92% FEV1 FEC ratio 106% total lung capacity 86% diffusion capacity 100%        Assessment & Plan:   Dyspnea Dyspnea with normal pulmonary function studies, echocardiogram, and CT angios the chest. The superior to be on the basis of restriction do to obesity. There is no evidence of active thromboembolic disease at this time.  Note no evidence of hypercoagulability Plan Given the fact the patient's been on anticoagulation for greater than 2 years could give consideration to discontinuing Coumadin therapy and watching No further pulmonary workup or treatment indicated     Updated Medication List Outpatient Encounter Prescriptions as of 09/12/2012  Medication Sig Dispense Refill  . furosemide (LASIX) 40 MG tablet Take 1 tablet (40 mg total) by mouth daily.  30 tablet  3  . ibuprofen (ADVIL,MOTRIN) 600 MG tablet Take 1 tablet (600 mg total) by mouth every 6 (six) hours as needed for pain.  30 tablet  1  . lovastatin (MEVACOR) 40 MG tablet Take 1 tablet (40 mg total) by mouth at bedtime.  30 tablet  3  . metFORMIN (GLUCOPHAGE) 500 MG tablet Take 1 tablet (500 mg total) by mouth 2 (two) times daily with a meal.  60 tablet  3  . omeprazole (PRILOSEC) 40 MG capsule Take 1 capsule (40 mg total) by mouth at bedtime.  60 capsule  1  . warfarin (COUMADIN) 5 MG tablet Take 1 tablet (5 mg total) by mouth daily.  30 tablet  1  . [DISCONTINUED] acetaminophen (TYLENOL) 500 MG tablet Take 1 tablet (500 mg total) by mouth every 6 (six) hours as needed for pain.  30 tablet  2   No facility-administered encounter medications on file as of 09/12/2012.

## 2012-09-12 NOTE — Assessment & Plan Note (Signed)
Dyspnea with normal pulmonary function studies, echocardiogram, and CT angios the chest. The superior to be on the basis of restriction do to obesity. There is no evidence of active thromboembolic disease at this time.  Note no evidence of hypercoagulability Plan Given the fact the patient's been on anticoagulation for greater than 2 years could give consideration to discontinuing Coumadin therapy and watching No further pulmonary workup or treatment indicated

## 2012-09-29 ENCOUNTER — Encounter: Payer: No Typology Code available for payment source | Admitting: Obstetrics & Gynecology

## 2012-09-30 ENCOUNTER — Ambulatory Visit: Payer: No Typology Code available for payment source | Attending: Internal Medicine | Admitting: Internal Medicine

## 2012-09-30 VITALS — BP 119/75 | HR 76 | Temp 98.7°F | Resp 15 | Wt 292.0 lb

## 2012-09-30 DIAGNOSIS — R0609 Other forms of dyspnea: Secondary | ICD-10-CM

## 2012-09-30 DIAGNOSIS — E785 Hyperlipidemia, unspecified: Secondary | ICD-10-CM

## 2012-09-30 DIAGNOSIS — I5032 Chronic diastolic (congestive) heart failure: Secondary | ICD-10-CM

## 2012-09-30 DIAGNOSIS — R0600 Dyspnea, unspecified: Secondary | ICD-10-CM

## 2012-09-30 DIAGNOSIS — Z5181 Encounter for therapeutic drug level monitoring: Secondary | ICD-10-CM

## 2012-09-30 DIAGNOSIS — I749 Embolism and thrombosis of unspecified artery: Secondary | ICD-10-CM

## 2012-09-30 DIAGNOSIS — E119 Type 2 diabetes mellitus without complications: Secondary | ICD-10-CM

## 2012-09-30 DIAGNOSIS — I2699 Other pulmonary embolism without acute cor pulmonale: Secondary | ICD-10-CM

## 2012-09-30 DIAGNOSIS — Z7901 Long term (current) use of anticoagulants: Secondary | ICD-10-CM

## 2012-09-30 DIAGNOSIS — I8290 Acute embolism and thrombosis of unspecified vein: Secondary | ICD-10-CM

## 2012-09-30 MED ORDER — FUROSEMIDE 40 MG PO TABS: 40.0000 mg | ORAL_TABLET | Freq: Every day | ORAL | Status: DC

## 2012-09-30 NOTE — Progress Notes (Signed)
Patient ID: Nima Bamburg, female   DOB: Sep 02, 1965, 47 y.o.   MRN: [id number] Patient Demographics  Teresa Potts, is a 47 y.o. female  WUJ:[id number]  NFA:[id number]  DOB - 23-Sep-1965  Chief Complaint  Patient presents with  . Follow-up        Subjective:   Teresa Potts today is here for a follow up visit. Patient has No headache, No chest pain, No abdominal pain - No Nausea, No new weakness tingling or numbness, No Cough - SOB.  Patient reports swelling of the lower extremities, improves with Lasix. Patient is currently on Coumadin. Patient was seen by Dr. Delford Field recently last month and quesetioned if patient can be taken off of Coumadin. - Patient is with an interpreter hence the detailed history was obtained with the help of interpreter. Patient reports that she is from [place], [place] and her first episode of pulmonary embolism was in 2011, when she was transferred immediately to [place]. She had right lower activity DVT. In 2012, patient was taken off Coumadin and soon after she developed a second episode of acute pulmonary embolism. Per patient both episodes were "severe". She denied any history of coagulation  disorder. Does not know if any genetic testing was done  Objective:    Filed Vitals:   09/30/12 1137  BP: 119/75  Pulse: 76  Temp: 98.7 F (37.1 C)  Resp: 15  Weight: 292 lb (132.45 kg)  SpO2: 100%     ALLERGIES:   Allergies  Allergen Reactions  . Adhesive [Tape] Rash    PAST MEDICAL HISTORY: Past Medical History  Diagnosis Date  . Diabetes mellitus without complication   . Hypertension   . Pulmonary embolism     MEDICATIONS AT HOME: Prior to Admission medications   Medication Sig Start Date End Date Taking? Authorizing Provider  furosemide (LASIX) 40 MG tablet Take 1 tablet (40 mg total) by mouth daily. 08/04/12   Leroy Sea, MD  ibuprofen (ADVIL,MOTRIN) 600 MG tablet Take 1 tablet (600 mg total) by mouth every 6 (six)  hours as needed for pain. 08/30/12   Jeanann Lewandowsky, MD  lovastatin (MEVACOR) 40 MG tablet Take 1 tablet (40 mg total) by mouth at bedtime. 08/04/12   Leroy Sea, MD  metFORMIN (GLUCOPHAGE) 500 MG tablet Take 1 tablet (500 mg total) by mouth 2 (two) times daily with a meal. 08/04/12   Leroy Sea, MD  omeprazole (PRILOSEC) 40 MG capsule Take 1 capsule (40 mg total) by mouth at bedtime. 08/30/12   Jeanann Lewandowsky, MD  warfarin (COUMADIN) 5 MG tablet Take 1 tablet (5 mg total) by mouth daily. 08/30/12   Jeanann Lewandowsky, MD     Exam  General appearance :Awake, alert, NAD, Speech Clear.  HEENT: Atraumatic and Normocephalic, PERLA Neck: supple, no JVD. No cervical lymphadenopathy.  Chest: Clear to auscultation bilaterally, no wheezing, rales or rhonchi CVS: S1 S2 regular, no murmurs.  Abdomen: Morbidly obese soft, NBS, NT, ND, no gaurding, rigidity or rebound. Extremities: no cyanosis or clubbing, B/L Lower Ext shows 1-2+ edema Neurology: Awake alert, and oriented X 3, CN II-XII intact, Non focal Skin: No Rash or lesions Wounds:N/A    Data Review   Basic Metabolic Panel: No results found for this basename: NA, K, CL, CO2, GLUCOSE, BUN, CREATININE, CALCIUM, MG, PHOS,  in the last 168 hours Liver Function Tests: No results found for this basename: AST, ALT, ALKPHOS, BILITOT, PROT, ALBUMIN,  in the last 168 hours  CBC: No results found for  this basename: WBC, NEUTROABS, HGB, HCT, MCV, PLT,  in the last 168 hours  ------------------------------------------------------------------------------------------------------------------ No results found for this basename: HGBA1C,  in the last 72 hours ------------------------------------------------------------------------------------------------------------------ No results found for this basename: CHOL, HDL, LDLCALC, TRIG, CHOLHDL, LDLDIRECT,  in the last 72  hours ------------------------------------------------------------------------------------------------------------------ No results found for this basename: TSH, T4TOTAL, FREET3, T3FREE, THYROIDAB,  in the last 72 hours ------------------------------------------------------------------------------------------------------------------ No results found for this basename: VITAMINB12, FOLATE, FERRITIN, TIBC, IRON, RETICCTPCT,  in the last 72 hours  Coagulation profile   Recent Labs Lab 09/30/12 1139  INR 2.8      Assessment & Plan   Active Problems: Recurrent pulmonary embolism, second episode - Patient was seen by pulmonology recently and have normal pulmonary function testing, 2-D echo showed EF of 55-60%, grade 1 diastolic dysfunction CT angiogram of the chest in 07/2012 showed no acute cardiopulmonary disease, no acute or chronic pulmonary embolism. Doppler ultrasound of the lower extremities are negative for acute DVT - Difficult situation as patient reports that she immediately got a second episode of pulmonary embolism when the Coumadin was stopped. She never had any genetic testing done. I have discussed in detail with the patient that there is a risk of having third pulmonary embolism episode after stopping the Coumadin or we can stop it the Coumadin and closely monitor her. Patient is somewhat apprehensive given she was sick and had a second episode of severe pulmonary embolism soon after starting the Coumadin. For now I will continue the Coumadin.    Bilateral pulmonary edema: Continue Lasix, ted hoses  Follow-up in 2 months      Alison Kubicki M.D. 09/30/2012, 12:34 PM

## 2012-09-30 NOTE — Progress Notes (Signed)
Follow up- DM\ Knee pain

## 2012-11-11 ENCOUNTER — Encounter: Payer: No Typology Code available for payment source | Admitting: Obstetrics and Gynecology

## 2012-11-30 ENCOUNTER — Ambulatory Visit: Payer: No Typology Code available for payment source

## 2012-12-02 ENCOUNTER — Ambulatory Visit: Payer: No Typology Code available for payment source | Attending: Internal Medicine | Admitting: Internal Medicine

## 2012-12-02 VITALS — BP 125/79 | HR 78 | Temp 98.4°F | Resp 16 | Ht 65.0 in | Wt 293.0 lb

## 2012-12-02 DIAGNOSIS — I2699 Other pulmonary embolism without acute cor pulmonale: Secondary | ICD-10-CM

## 2012-12-02 DIAGNOSIS — E119 Type 2 diabetes mellitus without complications: Secondary | ICD-10-CM | POA: Insufficient documentation

## 2012-12-02 DIAGNOSIS — M25569 Pain in unspecified knee: Secondary | ICD-10-CM

## 2012-12-02 DIAGNOSIS — Z86711 Personal history of pulmonary embolism: Secondary | ICD-10-CM | POA: Insufficient documentation

## 2012-12-02 DIAGNOSIS — M1711 Unilateral primary osteoarthritis, right knee: Secondary | ICD-10-CM

## 2012-12-02 DIAGNOSIS — M25561 Pain in right knee: Secondary | ICD-10-CM

## 2012-12-02 DIAGNOSIS — I5032 Chronic diastolic (congestive) heart failure: Secondary | ICD-10-CM | POA: Insufficient documentation

## 2012-12-02 DIAGNOSIS — E785 Hyperlipidemia, unspecified: Secondary | ICD-10-CM

## 2012-12-02 DIAGNOSIS — M1710 Unilateral primary osteoarthritis, unspecified knee: Secondary | ICD-10-CM

## 2012-12-02 LAB — BASIC METABOLIC PANEL
BUN: 10 mg/dL (ref 6–23)
CO2: 30 mEq/L (ref 19–32)
Calcium: 8.5 mg/dL (ref 8.4–10.5)
Creat: 0.62 mg/dL (ref 0.50–1.10)
Glucose, Bld: 99 mg/dL (ref 70–99)
Sodium: 139 mEq/L (ref 135–145)

## 2012-12-02 LAB — LIPID PANEL
Cholesterol: 146 mg/dL (ref 0–200)
HDL: 37 mg/dL — ABNORMAL LOW (ref >39)
Total CHOL/HDL Ratio: 3.9 Ratio
Triglycerides: 117 mg/dL (ref <150)

## 2012-12-02 LAB — POCT INR: INR: 1.4

## 2012-12-02 LAB — POCT GLYCOSYLATED HEMOGLOBIN (HGB A1C): Hemoglobin A1C: 6.1

## 2012-12-02 MED ORDER — OMEPRAZOLE 40 MG PO CPDR: 40.0000 mg | DELAYED_RELEASE_CAPSULE | Freq: Every day | ORAL | Status: DC

## 2012-12-02 MED ORDER — METFORMIN HCL 500 MG PO TABS: 500.0000 mg | ORAL_TABLET | Freq: Two times a day (BID) | ORAL | Status: DC

## 2012-12-02 MED ORDER — LOVASTATIN 40 MG PO TABS: 40.0000 mg | ORAL_TABLET | Freq: Every day | ORAL | Status: DC

## 2012-12-02 MED ORDER — FUROSEMIDE 40 MG PO TABS: 40.0000 mg | ORAL_TABLET | Freq: Every day | ORAL | Status: DC

## 2012-12-02 MED ORDER — WARFARIN SODIUM 5 MG PO TABS: 5.0000 mg | ORAL_TABLET | Freq: Every day | ORAL | Status: DC

## 2012-12-02 NOTE — Progress Notes (Signed)
Pt is here following up on her knee pain. Pt reports she has radiating pain from her lower back to her knees. Her knees is very stiff in the morning and the pain is causing her to have lack of sleep.

## 2012-12-02 NOTE — Progress Notes (Signed)
Patient ID: Teresa Potts, female   DOB: September 27, 1965, 47 y.o.   MRN: [id number]  CC: Follow up  HPI: 47 year old female with past medical history of pulmonary embolism, hypertension, diabetes who comes to clinic for followup. Patient reports having bilateral knee pain, back pain. She reports her pain is worse in the morning and throughout the day it gets better but it's never resolved. At first, pain is 7/10 in intensity and at best 4/10 in intensity. No swollen joints. No problems ambulating.  Allergies  Allergen Reactions  . Adhesive [Tape] Rash   Past Medical History  Diagnosis Date  . Diabetes mellitus without complication   . Hypertension   . Pulmonary embolism    Current Outpatient Prescriptions on File Prior to Visit  Medication Sig Dispense Refill  . ibuprofen (ADVIL,MOTRIN) 600 MG tablet Take 1 tablet (600 mg total) by mouth every 6 (six) hours as needed for pain.  30 tablet  1   No current facility-administered medications on file prior to visit.   Hypertension in mother.  History   Social History  . Marital Status: Married    Spouse Name: N/A    Number of Children: 3  . Years of Education: N/A   Occupational History  . Not on file.   Social History Main Topics  . Smoking status: Never Smoker   . Smokeless tobacco: Never Used  . Alcohol Use: Yes     Comment: occasionally  . Drug Use: No  . Sexual Activity: Not on file   Other Topics Concern  . Not on file   Social History Narrative  . No narrative on file    Review of Systems  Constitutional: Negative for fever, chills, diaphoresis, activity change, appetite change and fatigue.  HENT: Negative for ear pain, nosebleeds, congestion, facial swelling, rhinorrhea, neck pain, neck stiffness and ear discharge.   Eyes: Negative for pain, discharge, redness, itching and visual disturbance.  Respiratory: Negative for cough, choking, chest tightness, shortness of breath, wheezing and stridor.    Cardiovascular: Negative for chest pain, palpitations and leg swelling.  Gastrointestinal: Negative for abdominal distention.  Genitourinary: Negative for dysuria, urgency, frequency, hematuria, flank pain, decreased urine volume, difficulty urinating and dyspareunia.  Musculoskeletal: Per history of present illness  Neurological: Negative for dizziness, tremors, seizures, syncope, facial asymmetry, speech difficulty, weakness, light-headedness, numbness and headaches.  Hematological: Negative for adenopathy. Does not bruise/bleed easily.  Psychiatric/Behavioral: Negative for hallucinations, behavioral problems, confusion, dysphoric mood, decreased concentration and agitation.    Objective:   Filed Vitals:   12/02/12 1103  BP: 125/79  Pulse: 78  Temp: 98.4 F (36.9 C)  Resp: 16    Physical Exam  Constitutional: Appears well-developed and well-nourished. No distress.  HENT: Normocephalic. External right and left ear normal. Oropharynx is clear and moist.  Eyes: Conjunctivae and EOM are normal. PERRLA, no scleral icterus.  Neck: Normal ROM. Neck supple. No JVD. No tracheal deviation. No thyromegaly.  CVS: RRR, S1/S2 +, no murmurs, no gallops, no carotid bruit.  Pulmonary: Effort and breath sounds normal, no stridor, rhonchi, wheezes, rales.  Abdominal: Soft. BS +,  no distension, tenderness, rebound or guarding.  Musculoskeletal: Normal range of motion. No edema and no tenderness.  Lymphadenopathy: No lymphadenopathy noted, cervical, inguinal. Neuro: Alert. Normal reflexes, muscle tone coordination. No cranial nerve deficit. Skin: Skin is warm and dry. No rash noted. Not diaphoretic. No erythema. No pallor.  Psychiatric: Normal mood and affect. Behavior, judgment, thought content normal.   Lab Results  Component  Value Date   WBC 5.1 07/13/2012   HGB 13.0 07/13/2012   HCT 40.8 07/13/2012   MCV 80.5 07/13/2012   PLT 219 07/13/2012   Lab Results  Component Value Date   CREATININE  0.69 07/13/2012   BUN 13 07/13/2012   NA 137 07/13/2012   K 4.4 07/13/2012   CL 104 07/13/2012   CO2 27 07/13/2012    Lab Results  Component Value Date   HGBA1C 6.6 08/30/2012   Lipid Panel     Component Value Date/Time   CHOL 238* 07/13/2012 1128   TRIG 226* 07/13/2012 1128   HDL 49 07/13/2012 1128   CHOLHDL 4.9 07/13/2012 1128   VLDL 45* 07/13/2012 1128   LDLCALC 144* 07/13/2012 1128       Assessment and plan:   Patient Active Problem List   Diagnosis Date Noted  . Other and unspecified hyperlipidemia 09/30/2012    Priority: Medium - Check lipid panel today  - Continue statin therapy   . Other pulmonary embolism and infarction in 2011, 2nd episode in 2012 09/30/2012    Priority: Medium - On Coumadin 5 mg daily  - Check INR today   . Diabetes 08/30/2012    Priority: Medium - Check A1c today  - Continue metformin   . Right knee pain 08/11/2012    Priority: Medium - Secondary to arthritis, degenerative disease  - Referral to pain management clinic provide   . Chronic diastolic heart failure, NYHA class 1 08/04/2012    Priority: Medium - Continue Lasix

## 2012-12-02 NOTE — Patient Instructions (Addendum)
Anticoagulation, Generic Anticoagulants are medications used to prevent clots from developing in your veins. These medications are also known as blood thinners. If blood clots are untreated, they could travel to your lungs. This is called a pulmonary embolus. A blood clot in your lungs can be fatal.  Caregivers often use anticoagulants to prevent clots following surgery. Anticoagulants are also used along with aspirin when the heart is not getting enough blood. Another anticoagulant called warfarin is started 2 to 3 days after a rapid-acting injectable anticoagulant is started. The rapid-acting anticoagulants are usually continued until warfarin has begun to work. Your caregiver will judge this length of time by blood tests known as the prothrombin time (PT) and International Normalization Ratio (INR). This means that your blood is at the necessary and best level to prevent clots. RISKS AND COMPLICATIONS  If you have received recent epidural anesthesia, spinal anesthesia, or a spinal tap while receiving anticoagulants, you are at risk for developing a blood clot in or around the spine. This condition could result in long-term or permanent paralysis.  Because anticoagulants thin your blood, severe bleeding may occur from any tissue or organ. Symptoms of the blood being too thin may include:  Bleeding from the nose or gums that does not stop quickly.  Unusual bruising or bruising easily.  Swelling or pain at an injection site.  A cut that does not stop bleeding within 10 minutes.  Continual nausea for more than 1 day or vomiting blood.  Coughing up blood.  Blood in the urine which may appear as pink, red, or brown urine.  Blood in bowel movements which may appear as red, dark or black stools.  Sudden weakness or numbness of the face, arm, or leg, especially on one side of the body.  Sudden confusion.  Trouble speaking (aphasia) or understanding.  Sudden trouble seeing in one or both  eyes.  Sudden trouble walking.  Dizziness.  Loss of balance or coordination.  Severe pain, such as a headache, joint pain, or back pain.  Fever.  Too little anticoagulation continues to allow the risk for blood clots. HOME CARE INSTRUCTIONS   Due to the complications of anticoagulants, it is very important that you take your anticoagulant as directed by your caregiver. Anticoagulants need to be taken exactly as instructed. Be sure you understand all your anticoagulant instructions.  Warfarin. Your caregiver will advise you on the length of treatment (usually 3 6 months, sometimes lifelong).  Take warfarin exactly as directed by your caregiver. It is recommended that you take your warfarin dose at the same time of the day. It is preferred that you take warfarin in the late afternoon. If you have been told to stop taking warfarin, do not resume taking warfarin until directed to do so by your caregiver. Follow your caregiver's instructions if you accidentally take an extra dose or miss a dose of warfarin. It is very important to take warfarin as directed since bleeding or blood clots could result in chronic or permanent injury, pain, or disability.  Too much and too little warfarin are both dangerous. Too much warfarin increases the risk of bleeding. Too little warfarin continues to allow the risk for blood clots. While taking warfarin, you will need to have regular blood tests to measure your blood clotting time. These blood tests usually include both the PT and INR tests. The PT and INR results allow your caregiver to adjust your dose of warfarin. The dose can change for many reasons. It is critically   important that you take warfarin exactly as prescribed, and that you have your PT and INR levels drawn exactly as directed. Follow up with your laboratory test appointments as directed. It is very important to keep your lab appointments. Not keeping lab appointments could result in a chronic or  permanent injury, pain, or disability.  Many foods, especially foods high in vitamin K can interfere with warfarin and affect the PT and INR results. Foods high in vitamin K include spinach, kale, broccoli, cabbage, collard and turnip greens, brussels sprouts, peas, cauliflower, seaweed, and parsley as well as beef and pork liver, green tea, and soybean oil. You should eat a consistent amount of foods high in vitamin K. Avoid major changes in your diet, or notify your caregiver before changing your diet. Arrange a visit with a dietitian to answer your questions.  Many medicines can interfere with warfarin and affect the PT and INR results. You must tell your caregiver about any and all medicines you take, this includes all vitamins and supplements. Ask your caregiver before taking these. Prescription and over-the-counter medicine consistency is critical to warfarin management. It is important that potential interactions are checked before you start a new medicine. Be especially cautious with aspirin and anti-inflammatory medicines. Ask your caregiver before taking these. Medicines such as antibiotics and acid-reducing medicine can interact with warfarin and can cause an increased warfarin effect. Warfarin can also interfere with the effectiveness of medicines you are taking. Do not take or discontinue any prescribed or over-the-counter medicine except on the advice of your caregiver or pharmacist.  Some vitamins, supplements, and herbal products interfere with the effectiveness of warfarin. Vitamin E may increase the anticoagulant effects of warfarin. Vitamin K may can cause warfarin to be less effective. Do not take or discontinue any vitamin, supplement, or herbal product except on the advice of your caregiver or pharmacist.  Alcohol can change the body's ability to handle warfarin. It is best to avoid alcoholic drinks or consume only very small amounts while taking warfarin. Notify your caregiver if you  change your alcohol intake. A sudden increase in alcohol use can increase your risk of bleeding. Chronic alcohol use can cause warfarin to be less effective.  If you have a loss of appetite or get the stomach flu (viral gastroenteritis), talk to your caregiver as soon as possible. A decrease in your normal vitamin K intake can make you more sensitive to your usual dose of warfarin.  Some medical conditions may increase your risk for bleeding while you are taking warfarin. A fever, diarrhea lasting more than a day, worsening heart failure, or worsening liver function are some medical conditions that could affect warfarin. Contact your caregiver if you have any of these medical conditions.  Warfarin can have side effects, such as excessive bruising or bleeding. You will need to hold pressure over cuts for longer than usual.  Be careful not to cut yourself when using sharp objects.  Notify your dentist or other caregivers before procedures.  Limit physical activities or sports that could result in a fall or cause injury. Avoid contact sports.  Wear a medical alert bracelet or carry a medical alert card. SEEK MEDICAL CARE IF:   You develop any rashes.  You have any worsening of the condition for which you are receiving anticoagulation therapy. SEEK IMMEDIATE MEDICAL CARE IF:   Bleeding from the nose or gums does not stop quickly.  You have unusual bruising or are bruising easily.  Swelling or pain occurs   at an injection site.  A cut does not stop bleeding within 10 minutes.  You have continual nausea for more than 1 day or are vomiting blood.  You are coughing up blood.  You have blood in the urine.  You have dark or black stools.  You have sudden weakness or numbness of the face, arm, or leg, especially on one side of the body.  You have sudden confusion.  You have trouble speaking (aphasia) or understanding.  You have sudden trouble seeing in one or both eyes.  You have  sudden trouble walking.  You have dizziness.  You have a loss of balance or coordination.  You have severe pain, such as a headache, joint pain, or back pain.  You have a serious fall or head injury, even if you are not bleeding.  You have an oral temperature above 102 F (38.9 C), not controlled by medicine. ANY OF THESE SYMPTOMS MAY REPRESENT A SERIOUS PROBLEM THAT IS AN EMERGENCY. Do not wait to see if the symptoms will go away. Get medical help right away. Call your local emergency services (911 in [place] DO NOT drive yourself to the hospital. MAKE SURE YOU:   Understand these instructions.  Will watch your condition.  Will get help right away if you are not doing well or get worse. Document Released: 01/12/2005 Document Revised: 10/07/2011 Document Reviewed: 08/17/2007 [facility] Patient Information 2014 [place], [place].      INR was 1.4 doctor requested for her to take 10mg  tonight on Saturday take 5mg  on Sunday take 5 mg and return to clinic on Monday to recheck levels.

## 2012-12-05 ENCOUNTER — Other Ambulatory Visit: Payer: No Typology Code available for payment source

## 2012-12-16 ENCOUNTER — Emergency Department (HOSPITAL_COMMUNITY): Payer: No Typology Code available for payment source

## 2012-12-16 ENCOUNTER — Ambulatory Visit (INDEPENDENT_AMBULATORY_CARE_PROVIDER_SITE_OTHER): Payer: No Typology Code available for payment source | Admitting: Advanced Practice Midwife

## 2012-12-16 ENCOUNTER — Ambulatory Visit: Payer: No Typology Code available for payment source | Attending: Internal Medicine

## 2012-12-16 ENCOUNTER — Encounter: Payer: Self-pay | Admitting: Advanced Practice Midwife

## 2012-12-16 ENCOUNTER — Emergency Department (HOSPITAL_COMMUNITY)
Admission: EM | Admit: 2012-12-16 | Discharge: 2012-12-16 | Disposition: A | Discharge status: Home / Self Care | Payer: No Typology Code available for payment source | Attending: Emergency Medicine | Admitting: Emergency Medicine

## 2012-12-16 ENCOUNTER — Other Ambulatory Visit: Payer: No Typology Code available for payment source

## 2012-12-16 ENCOUNTER — Ambulatory Visit: Payer: No Typology Code available for payment source

## 2012-12-16 ENCOUNTER — Encounter (HOSPITAL_COMMUNITY): Payer: Self-pay | Admitting: Emergency Medicine

## 2012-12-16 VITALS — BP 145/84 | HR 82 | Temp 97.0°F | Ht 64.17 in | Wt 291.6 lb

## 2012-12-16 DIAGNOSIS — R071 Chest pain on breathing: Secondary | ICD-10-CM | POA: Insufficient documentation

## 2012-12-16 DIAGNOSIS — I2699 Other pulmonary embolism without acute cor pulmonale: Secondary | ICD-10-CM

## 2012-12-16 DIAGNOSIS — Z86711 Personal history of pulmonary embolism: Secondary | ICD-10-CM | POA: Insufficient documentation

## 2012-12-16 DIAGNOSIS — R079 Chest pain, unspecified: Secondary | ICD-10-CM

## 2012-12-16 DIAGNOSIS — I252 Old myocardial infarction: Secondary | ICD-10-CM | POA: Insufficient documentation

## 2012-12-16 DIAGNOSIS — R9389 Abnormal findings on diagnostic imaging of other specified body structures: Secondary | ICD-10-CM

## 2012-12-16 DIAGNOSIS — E119 Type 2 diabetes mellitus without complications: Secondary | ICD-10-CM | POA: Insufficient documentation

## 2012-12-16 DIAGNOSIS — R0600 Dyspnea, unspecified: Secondary | ICD-10-CM

## 2012-12-16 DIAGNOSIS — Z79899 Other long term (current) drug therapy: Secondary | ICD-10-CM | POA: Insufficient documentation

## 2012-12-16 DIAGNOSIS — Z7901 Long term (current) use of anticoagulants: Secondary | ICD-10-CM | POA: Insufficient documentation

## 2012-12-16 DIAGNOSIS — Z3202 Encounter for pregnancy test, result negative: Secondary | ICD-10-CM | POA: Insufficient documentation

## 2012-12-16 DIAGNOSIS — I10 Essential (primary) hypertension: Secondary | ICD-10-CM | POA: Insufficient documentation

## 2012-12-16 DIAGNOSIS — R0609 Other forms of dyspnea: Secondary | ICD-10-CM

## 2012-12-16 LAB — URINE MICROSCOPIC-ADD ON

## 2012-12-16 LAB — URINALYSIS, ROUTINE W REFLEX MICROSCOPIC
Glucose, UA: NEGATIVE mg/dL
Nitrite: NEGATIVE
Specific Gravity, Urine: 1.028 (ref 1.005–1.030)
Urobilinogen, UA: 0.2 mg/dL (ref 0.0–1.0)
pH: 6 (ref 5.0–8.0)

## 2012-12-16 LAB — BASIC METABOLIC PANEL
BUN: 13 mg/dL (ref 6–23)
Calcium: 8.9 mg/dL (ref 8.4–10.5)
GFR calc Af Amer: >90 mL/min (ref >90)
GFR calc non Af Amer: >90 mL/min (ref >90)
Glucose, Bld: 98 mg/dL (ref 70–99)
Potassium: 4.2 mEq/L (ref 3.5–5.1)
Sodium: 140 mEq/L (ref 135–145)

## 2012-12-16 LAB — CBC
Hemoglobin: 13 g/dL (ref 12.0–15.0)
MCH: 27.9 pg (ref 26.0–34.0)
MCHC: 33.2 g/dL (ref 30.0–36.0)
RDW: 16.5 % — ABNORMAL HIGH (ref 11.5–15.5)

## 2012-12-16 LAB — POCT INR

## 2012-12-16 LAB — POCT I-STAT TROPONIN I

## 2012-12-16 LAB — PROTIME-INR: Prothrombin Time: 22.7 seconds — ABNORMAL HIGH (ref 11.6–15.2)

## 2012-12-16 LAB — POCT PREGNANCY, URINE: Preg Test, Ur: NEGATIVE

## 2012-12-16 MED ORDER — IOHEXOL 350 MG/ML SOLN
80.0000 mL | Freq: Once | INTRAVENOUS | Status: AC | PRN
  Administered 2012-12-16: 80 mL via INTRAVENOUS

## 2012-12-16 MED ORDER — ASPIRIN 325 MG PO TABS: 325.0000 mg | ORAL_TABLET | ORAL | Status: DC

## 2012-12-16 MED ORDER — MORPHINE SULFATE 4 MG/ML IJ SOLN
4.0000 mg | Freq: Once | INTRAMUSCULAR | Status: AC
  Administered 2012-12-16: 4 mg via INTRAVENOUS
  Filled 2012-12-16: qty 1

## 2012-12-16 MED ORDER — HYDROCODONE-HOMATROPINE 5-1.5 MG/5ML PO SYRP: 5.0000 mL | ORAL_SOLUTION | Freq: Four times a day (QID) | ORAL | Status: AC | PRN

## 2012-12-16 MED ORDER — HYDROCODONE-HOMATROPINE 5-1.5 MG/5ML PO SYRP
5.0000 mL | ORAL_SOLUTION | Freq: Once | ORAL | Status: AC
  Administered 2012-12-16: 5 mL via ORAL
  Filled 2012-12-16: qty 5

## 2012-12-16 MED ORDER — MORPHINE SULFATE 4 MG/ML IJ SOLN: 4.0000 mg | Freq: Once | INTRAMUSCULAR | Status: DC

## 2012-12-16 NOTE — ED Notes (Signed)
Attempted to gain IV access X 2 RNs, unsuccessful. Paged IV team.

## 2012-12-16 NOTE — ED Notes (Signed)
Per interpreter- pt c/o left mid back pain that radiates into left breast that started at 9am while she was just sitting down. Denies n/v/d/ diaphoresis. Pt reports this doesn't feel exactly the same as her last PE. Reports last time there was more chest pressure. This time it hurts worse to breath. Denies sob. Nad, skin warm and dry, resp e/u.

## 2012-12-16 NOTE — ED Provider Notes (Signed)
CSN: [id number]     Arrival date & time 12/16/12  1223 History   First MD Initiated Contact with Patient 12/16/12 1241     Chief Complaint  Patient presents with  . Chest Pain   (Consider location/radiation/quality/duration/timing/severity/associated sxs/prior Treatment) HPI  47 year old female with prior history of MI and PE currently taking warfarin presents for evaluations of chest discomfort. History obtained through interpreter who was at bedside. Patient is from [place].  No recent travel. Report of acute onset of pleuritic chest discomfort which started at 9:30 this morning when she was walking to her car. Pain is moderate, worsening with taking deep breath or with cough. Pain started on both side of her chest radiates to back. Pain felt different from prior PE that she had in 2012. States in the past PE presents with chest pressure but this time is more pleuritic.  No fever, chills, hemoptysis, lightheadedness, dizziness, n/v/d, abd pain, dysuria or rash.  Recent increased of her warfarin 2 weeks ago as it was subtherapeutic. Has chronic bilateral leg swelling which has improved due to recently starting to wear compressive stocking.    Past Medical History  Diagnosis Date  . Diabetes mellitus without complication   . Hypertension   . Pulmonary embolism    History reviewed. No pertinent past surgical history. No family history on file. History  Substance Use Topics  . Smoking status: Never Smoker   . Smokeless tobacco: Never Used  . Alcohol Use: Yes     Comment: occasionally   OB History   Grav Para Term Preterm Abortions TAB SAB Ect Mult Living   3 3 3  0 0 0 0 0 0 3     Review of Systems  All other systems reviewed and are negative.    Allergies  Adhesive  Home Medications   Current Outpatient Rx  Name  Route  Sig  Dispense  Refill  . ibuprofen (ADVIL,MOTRIN) 600 MG tablet   Oral   Take 1 tablet (600 mg total) by mouth every 6 (six) hours as needed for pain.  30 tablet   1   . lovastatin (MEVACOR) 40 MG tablet   Oral   Take 1 tablet (40 mg total) by mouth at bedtime.   30 tablet   3     Can be switched to any other statin that is approv ...   . metFORMIN (GLUCOPHAGE) 500 MG tablet   Oral   Take 1 tablet (500 mg total) by mouth 2 (two) times daily with a meal.   60 tablet   3   . omeprazole (PRILOSEC) 40 MG capsule   Oral   Take 1 capsule (40 mg total) by mouth at bedtime.   30 capsule   3   . warfarin (COUMADIN) 5 MG tablet   Oral   Take 1 tablet (5 mg total) by mouth daily.   30 tablet   3    BP 107/60  Pulse 68  Temp(Src) 98.5 F (36.9 C) (Oral)  Resp 21  SpO2 98%  LMP 12/16/2012 Physical Exam  Nursing note and vitals reviewed. Constitutional: She is oriented to person, place, and time. She appears well-developed and well-nourished. No distress.  Awake, alert, nontoxic appearance  HENT:  Head: Atraumatic.  Eyes: Conjunctivae are normal. Right eye exhibits no discharge. Left eye exhibits no discharge.  Neck: Neck supple.  Cardiovascular: Normal rate and regular rhythm.   Pulmonary/Chest: Effort normal. No respiratory distress. She exhibits no tenderness.  Abdominal: Soft. There is  no tenderness. There is no rebound.  Musculoskeletal: She exhibits tenderness (moderate tenderness throughout posterior chest/back without focal point tenderness, midline spine tenderness or rash.  ).  ROM appears intact, no obvious focal weakness  Neurological: She is alert and oriented to person, place, and time.  Mental status and motor strength appears intact  Skin: No rash noted.  Psychiatric: She has a normal mood and affect.    ED Course  Procedures (including critical care time)  3:00 PM Hx obtained with the help of interpreter.  Pt with sudden onset pleuritic chest pain, prior hx of PE currently on warfarin.  She however had tenderness throughout upper and mid back on palpation without overlying skin changes.  No  lightheadedness or dizziness to suggest aortic dissection.  Due to prior hx of PE, will obtain chest CT angio to r/o PE.  Pain medication.  Care discussed with attending.  Work up initiated.  Doubt ACS.  3:24 PM INR therapeutic at 2.08.  ECG and troponin unremarkable, CR without active cardiopulmonary disease.  Labs are reassuring, normal renal function.    4:20 PM Chest CTA without evidence of PE.  There's mass identified on the previous exam at the posterior aspect R lobe liver.  Radiologist recommend outpt nonemergent MR imaging with and without contrast for further evaluation.  I do not think this finding is related to today's presentation.  Pt does have PCP and i do encourage pt to f/u with PCP for further evaluation.  Currently low suspicion for aortic dissection, PE, ACS, PNA, CHF, COPD. No overlying skin changes to back to suggest cellulitis or abscess. Pt without URI sxs and also without dizziness or lightheadedness.  Sats normal with ambulation.  Plan to d/c with pain meds, close f/u and strict return precatuion.  Care discussed with attending.     Labs Review Labs Reviewed  CBC - Abnormal; Notable for the following:    RDW 16.5 (*)    All other components within normal limits  PROTIME-INR - Abnormal; Notable for the following:    Prothrombin Time 22.7 (*)    INR 2.08 (*)    All other components within normal limits  URINALYSIS, ROUTINE W REFLEX MICROSCOPIC - Abnormal; Notable for the following:    Hgb urine dipstick LARGE (*)    Leukocytes, UA SMALL (*)    All other components within normal limits  BASIC METABOLIC PANEL  URINE MICROSCOPIC-ADD ON  POCT I-STAT TROPONIN I  POCT PREGNANCY, URINE   Imaging Review No results found.  EKG Interpretation    Date/Time:  Friday December 16 2012 12:36:09 EST Ventricular Rate:  80 PR Interval:  134 QRS Duration: 90 QT Interval:  376 QTC Calculation: 434 R Axis:   63 Text Interpretation:  Artifact Sinus rhythm No old tracing to  compare Confirmed by [facility]  MD, Nicholos Johns [phone]) on 12/16/2012 12:49:11 PM            MDM   1. Chest pain   2. Abnormal CT scan, chest    BP 111/66  Pulse 73  Temp(Src) 98.5 F (36.9 C) (Oral)  Resp 16  SpO2 98%  LMP 12/16/2012  I have reviewed nursing notes and vital signs. I personally reviewed the imaging tests through PACS system  I reviewed available ER/hospitalization records thought the EMR     Fayrene Helper, [place] 12/16/12 1633

## 2012-12-16 NOTE — ED Notes (Signed)
Pt ambulated to restroom with no issues.  

## 2012-12-16 NOTE — Progress Notes (Signed)
This is a 47 y.o. female who presented for [facility] exam, pap and mammogram order.  Upon interview by RN, she reported onset of severe chest pain and difficulty breathing since 9am.  Is reported to have had 2 PEs in past.   So the clinic staff has called EMS to transport her to [facility] ED.  Filed Vitals:   12/16/12 1129  BP: 145/84  Pulse: 82  Temp: 97 F (36.1 C)   O2 saturation 98%.  Heart rate 95bpm per auscultation. Lungs clear in upper lobes bilaterally.  Unable to hear lower lobes well. Upon arrival of EMS, Sinus rhythm with occasional PVC and PJC.  Dispo to [facility] ED

## 2012-12-16 NOTE — ED Notes (Signed)
IV team returned paged, pt is on the list and they will be here as soon as they can.

## 2012-12-16 NOTE — ED Notes (Signed)
CT notified of pt's IV

## 2012-12-16 NOTE — Progress Notes (Signed)
Patient reports pain in her sides when she breathes starting at 9am; reports some difficulty breathing and has slight headache; denies blurry vision or numbness

## 2012-12-16 NOTE — ED Notes (Signed)
Lab at bedside

## 2012-12-16 NOTE — ED Notes (Signed)
Pt returned from radiology.

## 2012-12-16 NOTE — ED Notes (Signed)
Called phlebotomy to update we were unable to draw bld.

## 2012-12-16 NOTE — ED Notes (Signed)
IV team requested another RN attempt. Nikki RN at bedside to attempt IV start

## 2012-12-16 NOTE — ED Notes (Signed)
Pt undressed, in gown, on monitor, continuous pulse oximetry and blood pressure cuff; interpreter at bedside

## 2012-12-16 NOTE — ED Notes (Signed)
Per EMS - pt coming from clinic at [facility] hospital. C/o squeezing around chest along with sob. Pt has hx of MI and 2 PEs. Pt describes this feeling the same as her past PEs. Staff reports she has been coughing while at the office. Hasn't been coughing anything up. BP 118 palpated. HR 74. 98% on room air. NSR. R 16. 12 lead unremarkable.

## 2012-12-16 NOTE — ED Notes (Signed)
Pt has returned from being out of the department; pt placed back on monitor, continuous pulse oximetry and blood pressure cuff; interpreter is at bedside

## 2012-12-19 NOTE — ED Provider Notes (Signed)
Medical screening examination/treatment/procedure(s) were performed by non-physician practitioner and as supervising physician I was immediately available for consultation/collaboration.  EKG Interpretation    Date/Time:  Friday December 16 2012 12:36:09 EST Ventricular Rate:  80 PR Interval:  134 QRS Duration: 90 QT Interval:  376 QTC Calculation: 434 R Axis:   63 Text Interpretation:  Artifact Sinus rhythm No old tracing to compare Confirmed by [facility]  MD, Nidal Rivet [phone]) on 12/16/2012 12:49:11 PM              Laray Anger, DO 12/19/12 [phone]

## 2013-01-05 ENCOUNTER — Ambulatory Visit: Payer: No Typology Code available for payment source | Attending: Internal Medicine | Admitting: Internal Medicine

## 2013-01-05 ENCOUNTER — Encounter: Payer: Self-pay | Admitting: Internal Medicine

## 2013-01-05 VITALS — BP 129/84 | HR 74 | Temp 98.1°F | Resp 16 | Ht 65.0 in | Wt 293.0 lb

## 2013-01-05 DIAGNOSIS — K7689 Other specified diseases of liver: Secondary | ICD-10-CM

## 2013-01-05 DIAGNOSIS — K769 Liver disease, unspecified: Secondary | ICD-10-CM

## 2013-01-05 DIAGNOSIS — M1710 Unilateral primary osteoarthritis, unspecified knee: Secondary | ICD-10-CM

## 2013-01-05 DIAGNOSIS — Z7901 Long term (current) use of anticoagulants: Secondary | ICD-10-CM | POA: Insufficient documentation

## 2013-01-05 DIAGNOSIS — I2699 Other pulmonary embolism without acute cor pulmonale: Secondary | ICD-10-CM | POA: Insufficient documentation

## 2013-01-05 DIAGNOSIS — M1711 Unilateral primary osteoarthritis, right knee: Secondary | ICD-10-CM | POA: Insufficient documentation

## 2013-01-05 DIAGNOSIS — I5032 Chronic diastolic (congestive) heart failure: Secondary | ICD-10-CM

## 2013-01-05 DIAGNOSIS — E1110 Type 2 diabetes mellitus with ketoacidosis without coma: Secondary | ICD-10-CM

## 2013-01-05 DIAGNOSIS — E1310 Other specified diabetes mellitus with ketoacidosis without coma: Secondary | ICD-10-CM

## 2013-01-05 DIAGNOSIS — E119 Type 2 diabetes mellitus without complications: Secondary | ICD-10-CM | POA: Insufficient documentation

## 2013-01-05 LAB — GLUCOSE, POCT (MANUAL RESULT ENTRY): POC Glucose: 99 mg/dl (ref 70–99)

## 2013-01-05 MED ORDER — RIVAROXABAN 15 MG PO TABS: 15.0000 mg | ORAL_TABLET | Freq: Two times a day (BID) | ORAL | Status: AC

## 2013-01-05 MED ORDER — RIVAROXABAN 20 MG PO TABS: 20.0000 mg | ORAL_TABLET | Freq: Every day | ORAL | Status: DC

## 2013-01-05 NOTE — Progress Notes (Signed)
Pt is here following up on her diabetes and pulmonary embolism.

## 2013-01-05 NOTE — Progress Notes (Signed)
Patient ID: Teresa Potts, female   DOB: [birth year], 47 y.o.   MRN: [id number] Patient Demographics  Teresa Potts, is a 47 y.o. female  [id number]  [id number]  DOB - February 15, 1965  Chief Complaint  Patient presents with  . Establish Care        Subjective:   Teresa Potts is a 47 y.o. female here today for a follow up visit. Patient has been on Coumadin for 2 episodes of pulmonary embolism. She is complaining today that she could not keep up off nutritional requirement for being on Coumadin, she does love green vegetables, and in an attempt to lose weight and that is all she eats, her INR has been mostly subtherapeutic. She has no new complaints today. Her INR is 1.7 today. She is also known to have diabetes on metformin 500 mg tablet by mouth twice a day, and dyslipidemia on lovastatin 40 mg tablet by mouth daily, her blood pressure is mostly diet controlled.  Patient has No headache, No chest pain, No abdominal pain - No Nausea, No new weakness tingling or numbness, No Cough - SOB.  ALLERGIES: Allergies  Allergen Reactions  . Adhesive [Tape] Rash    PAST MEDICAL HISTORY: Past Medical History  Diagnosis Date  . Diabetes mellitus without complication   . Hypertension   . Pulmonary embolism     MEDICATIONS AT HOME: Prior to Admission medications   Medication Sig Start Date End Date Taking? Authorizing Provider  ibuprofen (ADVIL,MOTRIN) 600 MG tablet Take 1 tablet (600 mg total) by mouth every 6 (six) hours as needed for pain. 08/30/12  Yes Jeanann Lewandowsky, MD  lovastatin (MEVACOR) 40 MG tablet Take 1 tablet (40 mg total) by mouth at bedtime. 12/02/12  Yes Alison Murray, MD  metFORMIN (GLUCOPHAGE) 500 MG tablet Take 1 tablet (500 mg total) by mouth 2 (two) times daily with a meal. 12/02/12  Yes Alison Murray, MD  omeprazole (PRILOSEC) 40 MG capsule Take 1 capsule (40 mg total) by mouth at bedtime. 12/02/12  Yes Alison Murray, MD  HYDROcodone-homatropine  [facility]) 5-1.5 MG/5ML syrup Take 5 mLs by mouth every 6 (six) hours as needed for cough (or pain). 12/16/12   Fayrene Helper, PA-C  Rivaroxaban (XARELTO) 15 MG TABS tablet Take 1 tablet (15 mg total) by mouth 2 (two) times daily with a meal. 01/05/13 01/26/13  Jeanann Lewandowsky, MD  Rivaroxaban (XARELTO) 20 MG TABS tablet Take 1 tablet (20 mg total) by mouth daily with supper. 01/27/13   Jeanann Lewandowsky, MD     Objective:   Filed Vitals:   01/05/13 1121  BP: 129/84  Pulse: 74  Temp: 98.1 F (36.7 C)  TempSrc: Oral  Resp: 16  Height: 5\' 5"  (1.651 m)  Weight: 293 lb (132.904 kg)  SpO2: 97%    Exam General appearance : Awake, alert, not in any distress. Speech Clear. Not toxic looking, morbidly obese HEENT: Atraumatic and Normocephalic, pupils equally reactive to light and accomodation Neck: supple, no JVD. No cervical lymphadenopathy.  Chest:Good air entry bilaterally, no added sounds  CVS: S1 S2 regular, no murmurs.  Abdomen: Bowel sounds present, Non tender and not distended with no gaurding, rigidity or rebound. Extremities: B/L Lower Ext shows no edema, both legs are warm to touch Neurology: Awake alert, and oriented X 3, CN II-XII intact, Non focal Skin:No Rash Wounds:N/A   Data Review   CBC No results found for this basename: WBC, HGB, HCT, PLT, MCV, MCH, MCHC, RDW, NEUTRABS, LYMPHSABS, MONOABS, EOSABS, BASOSABS,  BANDABS, BANDSABD,  in the last 168 hours  Chemistries   No results found for this basename: NA, K, CL, CO2, GLUCOSE, BUN, CREATININE, GFRCGP, CALCIUM, MG, AST, ALT, ALKPHOS, BILITOT,  in the last 168 hours ------------------------------------------------------------------------------------------------------------------ No results found for this basename: HGBA1C,  in the last 72 hours ------------------------------------------------------------------------------------------------------------------ No results found for this basename: CHOL, HDL, LDLCALC, TRIG,  CHOLHDL, LDLDIRECT,  in the last 72 hours ------------------------------------------------------------------------------------------------------------------ No results found for this basename: TSH, T4TOTAL, FREET3, T3FREE, THYROIDAB,  in the last 72 hours ------------------------------------------------------------------------------------------------------------------ No results found for this basename: VITAMINB12, FOLATE, FERRITIN, TIBC, IRON, RETICCTPCT,  in the last 72 hours  Coagulation profile   Recent Labs Lab 01/05/13 1133  INR 1.7      Assessment & Plan   1. Diabetes  - Glucose (CBG)  2. Pulmonary embolism Stop Coumadin, change to Rivaroxaban - Rivaroxaban (XARELTO) 15 MG TABS tablet; Take 1 tablet (15 mg total) by mouth 2 (two) times daily with a meal.  Dispense: 42 tablet; Refill: 0 - Rivaroxaban (XARELTO) 20 MG TABS tablet; Take 1 tablet (20 mg total) by mouth daily with supper.  Dispense: 90 tablet; Refill: 4  3. DM (diabetes mellitus) type 2, uncontrolled, with ketoacidosis Continue current regimen of metformin 500 mg tablet by mouth twice a day  4. Osteoarthritis of right knee Stable  5. Long term (current) use of anticoagulants We will stop Coumadin, start patient on: - Rivaroxaban (XARELTO) 15 MG TABS tablet; Take 1 tablet (15 mg total) by mouth 2 (two) times daily with a meal.  Dispense: 42 tablet; Refill: 0 - Rivaroxaban (XARELTO) 20 MG TABS tablet; Take 1 tablet (20 mg total) by mouth daily with supper.  Dispense: 90 tablet; Refill: 4  6. Chronic diastolic heart failure, NYHA class 1 Stable  7. Liver lesion We'll followup with: - MR Liver W Wo Contrast; Future  Health Maintenance -Vaccinations:  -Influenza patient declined  Follow up in 4 weeks or when necessary Patient was extensively counseled on nutrition and exercise Interpreter was used to communicate directly with patient for the entire encounter including providing detailed patient  instructions.   The patient was given clear instructions to go to ER or return to medical center if symptoms don't improve, worsen or new problems develop. The patient verbalized understanding. The patient was told to call to get lab results if they haven't heard anything in the next week.    Jeanann Lewandowsky, MD, MHA, FACP, FAAP [facility] and [facility] [place], [place] [phone]   01/05/2013, 12:19 PM

## 2013-01-05 NOTE — Patient Instructions (Signed)
Rivaroxaban oral tablets What is this medicine? RIVAROXABAN (ri va ROX a ban) is an anticoagulant (blood thinner). It is used to treat blood clots in the lungs or in the veins. It is also used after knee or hip surgeries to prevent blood clots. It is also used to lower the chance of stroke in people with a medical condition called atrial fibrillation. This medicine may be used for other purposes; ask your health care provider or pharmacist if you have questions. COMMON BRAND NAME(S): Xarelto What should I tell my health care provider before I take this medicine? They need to know if you have any of these conditions: -bleeding disorders -bleeding in the brain -blood in your stools (black or tarry stools) or if you have blood in your vomit -history of stomach bleeding -kidney disease -liver disease -low blood counts, like low white cell, platelet, or red cell counts -recent or planned spinal or epidural procedure -take medicines that treat or prevent blood clots -an unusual or allergic reaction to rivaroxaban, other medicines, foods, dyes, or preservatives -pregnant or trying to get pregnant -breast-feeding How should I use this medicine? Take this medicine by mouth with a glass of water. Follow the directions on the prescription label. Take your medicine at regular intervals. Do not take it more often than directed. Do not stop taking except on your doctor's advice. Stopping this medicine may increase your risk of a blot clot. Be sure to refill your prescription before you run out of medicine. If you are taking this medicine after hip or knee replacement surgery, take it with or without food. If you are taking this medicine for atrial fibrillation, take it with your evening meal. If you are taking this medicine to treat blood clots, take it with food at the same time each day. If you are unable to swallow your tablet, you may crush the tablet and mix it in applesauce. Then, immediately eat the  applesauce. You should eat more food right after you eat the applesauce containing the crushed tablet. Talk to your pediatrician regarding the use of this medicine in children. Special care may be needed. Overdosage: If you think you have taken too much of this medicine contact a poison control center or emergency room at once. NOTE: This medicine is only for you. Do not share this medicine with others. What if I miss a dose? If you take your medicine once a day and miss a dose, take the missed dose as soon as you remember. If you take your medicine twice a day and miss a dose, take the missed dose immediately. In this instance, 2 tablets may be taken at the same time. The next day you should take 1 tablet twice a day as directed. What may interact with this medicine? -aspirin and aspirin-like medicines -certain antibiotics like erythromycin, azithromycin, and clarithromycin -certain medicines for fungal infections like ketoconazole and itraconazole -certain medicines for irregular heart beat like amiodarone, quinidine, dronedarone -certain medicines for seizures like carbamazepine, phenytoin -certain medicines that treat or prevent blood clots like warfarin, enoxaparin, and dalteparin  -conivaptan -diltiazem -felodipine -indinavir -lopinavir; ritonavir -NSAIDS, medicines for pain and inflammation, like ibuprofen or naproxen -ranolazine -rifampin -ritonavir -St. John's wort -verapamil This list may not describe all possible interactions. Give your health care provider a list of all the medicines, herbs, non-prescription drugs, or dietary supplements you use. Also tell them if you smoke, drink alcohol, or use illegal drugs. Some items may interact with your medicine. What should I   watch for while using this medicine? Visit your doctor or health care professional for regular checks on your progress. Your condition will be monitored carefully while you are receiving this medicine. If you are  going to have surgery, tell your doctor or health care professional that you are taking this medicine. Avoid sports and activities that might cause injury while you are using this medicine. Severe falls or injuries can cause unseen bleeding. Be careful when using sharp tools or knives. Consider using an electric razor. Take special care brushing or flossing your teeth. Report any injuries, bruising, or red spots on the skin to your doctor or health care professional. What side effects may I notice from receiving this medicine? Side effects that you should report to your doctor or health care professional as soon as possible: -allergic reactions like skin rash, itching or hives, swelling of the face, lips, or tongue -back pain -confusion, trouble speaking or understanding -redness, blistering, peeling or loosening of the skin, including inside the mouth -signs and symptoms of bleeding such as bloody or black, tarry stools; red or dark-brown urine; spitting up blood or brown material that looks like coffee grounds; red spots on the skin; unusual bruising or bleeding from the eye, gums, or nose -signs and symptoms of a blood clot such as breathing problems; changes in vision; chest pain; severe, sudden headache; pain, swelling, warmth in the leg; trouble speaking; sudden numbness or weakness of the face, arm, or leg -trouble walking, dizziness, loss of balance or coordination  Side effects that usually do not require medical attention (Report these to your doctor or health care professional if they continue or are bothersome.): -dizziness -muscle pain This list may not describe all possible side effects. Call your doctor for medical advice about side effects. You may report side effects to FDA at 1-800-FDA-1088. Where should I keep my medicine? Keep out of the reach of children. Store at room temperature between 15 and 30 degrees C (59 and 86 degrees F). Throw away any unused medicine after the  expiration date. NOTE: This sheet is a summary. It may not cover all possible information. If you have questions about this medicine, talk to your doctor, pharmacist, or health care provider.  2014, [employer]. (2012-06-29 09:51:31)  

## 2013-01-16 ENCOUNTER — Ambulatory Visit (HOSPITAL_COMMUNITY)
Admission: RE | Admit: 2013-01-16 | Discharge: 2013-01-16 | Disposition: A | Discharge status: Home / Self Care | Payer: No Typology Code available for payment source | Source: Ambulatory Visit | Attending: Internal Medicine | Admitting: Internal Medicine

## 2013-01-16 DIAGNOSIS — D1803 Hemangioma of intra-abdominal structures: Secondary | ICD-10-CM | POA: Insufficient documentation

## 2013-01-16 DIAGNOSIS — K7689 Other specified diseases of liver: Secondary | ICD-10-CM | POA: Insufficient documentation

## 2013-01-16 DIAGNOSIS — K769 Liver disease, unspecified: Secondary | ICD-10-CM

## 2013-01-16 MED ORDER — GADOXETATE DISODIUM 0.25 MMOL/ML IV SOLN
10.0000 mL | Freq: Once | INTRAVENOUS | Status: AC | PRN
  Administered 2013-01-16: 10 mL via INTRAVENOUS

## 2013-02-06 ENCOUNTER — Ambulatory Visit: Payer: No Typology Code available for payment source | Attending: Internal Medicine | Admitting: Internal Medicine

## 2013-02-06 ENCOUNTER — Encounter: Payer: Self-pay | Admitting: Internal Medicine

## 2013-02-06 VITALS — BP 136/81 | HR 75 | Temp 98.1°F | Resp 16 | Ht 66.0 in | Wt 294.0 lb

## 2013-02-06 DIAGNOSIS — I2699 Other pulmonary embolism without acute cor pulmonale: Secondary | ICD-10-CM | POA: Insufficient documentation

## 2013-02-06 DIAGNOSIS — E785 Hyperlipidemia, unspecified: Secondary | ICD-10-CM | POA: Insufficient documentation

## 2013-02-06 DIAGNOSIS — E1110 Type 2 diabetes mellitus with ketoacidosis without coma: Secondary | ICD-10-CM | POA: Insufficient documentation

## 2013-02-06 DIAGNOSIS — M1710 Unilateral primary osteoarthritis, unspecified knee: Secondary | ICD-10-CM

## 2013-02-06 DIAGNOSIS — E1310 Other specified diabetes mellitus with ketoacidosis without coma: Secondary | ICD-10-CM

## 2013-02-06 DIAGNOSIS — I10 Essential (primary) hypertension: Secondary | ICD-10-CM | POA: Insufficient documentation

## 2013-02-06 DIAGNOSIS — M1711 Unilateral primary osteoarthritis, right knee: Secondary | ICD-10-CM

## 2013-02-06 DIAGNOSIS — IMO0002 Reserved for concepts with insufficient information to code with codable children: Secondary | ICD-10-CM

## 2013-02-06 LAB — GLUCOSE, POCT (MANUAL RESULT ENTRY): POC GLUCOSE: 94 mg/dL (ref 70–99)

## 2013-02-06 MED ORDER — METFORMIN HCL 500 MG PO TABS: 500.0000 mg | ORAL_TABLET | Freq: Two times a day (BID) | ORAL | Status: AC

## 2013-02-06 MED ORDER — OMEPRAZOLE 40 MG PO CPDR: 40.0000 mg | DELAYED_RELEASE_CAPSULE | Freq: Every day | ORAL | Status: AC

## 2013-02-06 MED ORDER — WARFARIN SODIUM 5 MG PO TABS: 5.0000 mg | ORAL_TABLET | Freq: Every day | ORAL | Status: AC

## 2013-02-06 MED ORDER — LOVASTATIN 40 MG PO TABS: 40.0000 mg | ORAL_TABLET | Freq: Every day | ORAL | Status: AC

## 2013-02-06 NOTE — Progress Notes (Signed)
Pt is here following up on her HTN and diabetes. 

## 2013-02-08 NOTE — Progress Notes (Signed)
Patient ID: Teresa Potts, female   DOB: 11/22/1965, 48 y.o.   MRN: [id number] Patient Demographics  Lakayla Barrington, is a 48 y.o. female  [id number]  [id number]  DOB - Apr 28, 1965  Chief Complaint  Patient presents with  . Follow-up        Subjective:   Teresa Potts is a 48 y.o. female here today for a follow up visit. Patient did not take Xarelto because she got scared of the side effects that was advertised on TV and on the Internet. She would prefer to go back to her Coumadin regular INR check.  She has no complaints today, she also needs refills on some of her medications. Her blood pressure has been controlled at home, she is doing a little better with her blood sugar. She does not smoke cigarette and she does not drink alcohol. Patient has No headache, No chest pain, No abdominal pain - No Nausea, No new weakness tingling or numbness, No Cough - SOB.  ALLERGIES: Allergies  Allergen Reactions  . Adhesive [Tape] Rash    PAST MEDICAL HISTORY: Past Medical History  Diagnosis Date  . Diabetes mellitus without complication   . Hypertension   . Pulmonary embolism     MEDICATIONS AT HOME: Prior to Admission medications   Medication Sig Start Date End Date Taking? Authorizing Provider  HYDROcodone-homatropine (HYCODAN) 5-1.5 MG/5ML syrup Take 5 mLs by mouth every 6 (six) hours as needed for cough (or pain). 12/16/12   Domenic Moras, PA-C  ibuprofen (ADVIL,MOTRIN) 600 MG tablet Take 1 tablet (600 mg total) by mouth every 6 (six) hours as needed for pain. 08/30/12   Angelica Chessman, MD  lovastatin (MEVACOR) 40 MG tablet Take 1 tablet (40 mg total) by mouth at bedtime. 02/06/13   Angelica Chessman, MD  metFORMIN (GLUCOPHAGE) 500 MG tablet Take 1 tablet (500 mg total) by mouth 2 (two) times daily with a meal. 02/06/13   Angelica Chessman, MD  omeprazole (PRILOSEC) 40 MG capsule Take 1 capsule (40 mg total) by mouth at bedtime. 02/06/13   Angelica Chessman, MD   Rivaroxaban (XARELTO) 15 MG TABS tablet Take 1 tablet (15 mg total) by mouth 2 (two) times daily with a meal. 01/05/13 01/26/13  Angelica Chessman, MD  warfarin (COUMADIN) 5 MG tablet Take 1 tablet (5 mg total) by mouth daily at 6 PM. 02/06/13   Angelica Chessman, MD     Objective:   Filed Vitals:   02/06/13 1154  BP: 136/81  Pulse: 75  Temp: 98.1 F (36.7 C)  TempSrc: Oral  Resp: 16  Height: 5\' 6"  (1.676 m)  Weight: 294 lb (133.358 kg)  SpO2: 97%    Exam General appearance : Awake, alert, not in any distress. Speech Clear. Not toxic looking, morbidly obese HEENT: Atraumatic and Normocephalic, pupils equally reactive to light and accomodation Neck: supple, no JVD. No cervical lymphadenopathy.  Chest:Good air entry bilaterally, no added sounds  CVS: S1 S2 regular, no murmurs.  Abdomen: Bowel sounds present, Non tender and not distended with no gaurding, rigidity or rebound. Extremities: B/L Lower Ext shows no edema, both legs are warm to touch Neurology: Awake alert, and oriented X 3, CN II-XII intact, Non focal Skin:No Rash Wounds:N/A   Data Review   CBC No results found for this basename: WBC, HGB, HCT, PLT, MCV, MCH, MCHC, RDW, NEUTRABS, LYMPHSABS, MONOABS, EOSABS, BASOSABS, BANDABS, BANDSABD,  in the last 168 hours  Chemistries   No results found for this basename: NA, K, CL, CO2, GLUCOSE, BUN,  CREATININE, GFRCGP, CALCIUM, MG, AST, ALT, ALKPHOS, BILITOT,  in the last 168 hours ------------------------------------------------------------------------------------------------------------------ No results found for this basename: HGBA1C,  in the last 72 hours ------------------------------------------------------------------------------------------------------------------ No results found for this basename: CHOL, HDL, LDLCALC, TRIG, CHOLHDL, LDLDIRECT,  in the last 72  hours ------------------------------------------------------------------------------------------------------------------ No results found for this basename: TSH, T4TOTAL, FREET3, T3FREE, THYROIDAB,  in the last 72 hours ------------------------------------------------------------------------------------------------------------------ No results found for this basename: VITAMINB12, FOLATE, FERRITIN, TIBC, IRON, RETICCTPCT,  in the last 72 hours  Coagulation profile  No results found for this basename: INR, PROTIME,  in the last 168 hours    Assessment & Plan   Liver MRI report reviewed with patient  1. Osteoarthritis of right knee Patient is doing well but over the counter ibuprofen occasionally - omeprazole (PRILOSEC) 40 MG capsule; Take 1 capsule (40 mg total) by mouth at bedtime.  Dispense: 90 capsule; Refill: 3  2. DM (diabetes mellitus) type 2, uncontrolled, with ketoacidosis Refill - metFORMIN (GLUCOPHAGE) 500 MG tablet; Take 1 tablet (500 mg total) by mouth 2 (two) times daily with a meal.  Dispense: 180 tablet; Refill: 3 - Glucose (CBG)  3. Other pulmonary embolism and infarction Refill - warfarin (COUMADIN) 5 MG tablet; Take 1 tablet (5 mg total) by mouth daily at 6 PM.  Dispense: 30 tablet; Refill: 0 For INR check in one week  4. Essential hypertension, benign Continue the present regimen Patient extensively counseled about nutrition and exercise Blood pressure goals were discussed  5. Dyslipidemia Continue - lovastatin (MEVACOR) 40 MG tablet; Take 1 tablet (40 mg total) by mouth at bedtime.  Dispense: 90 tablet; Refill: 3   Follow up in one week for INR check, 3 months or when necessary for followup  Interpreter was used to communicate directly with patient for the entire encounter including providing detailed patient instructions.   The patient was given clear instructions to go to ER or return to medical center if symptoms don't improve, worsen or new problems  develop. The patient verbalized understanding. The patient was told to call to get lab results if they haven't heard anything in the next week.    Angelica Chessman, MD, [place], [place], [place] and [place], [place]   02/08/2013, 1:06 PM

## 2013-02-13 ENCOUNTER — Other Ambulatory Visit: Payer: No Typology Code available for payment source

## 2013-02-20 ENCOUNTER — Other Ambulatory Visit: Payer: No Typology Code available for payment source

## 2013-05-08 ENCOUNTER — Ambulatory Visit: Payer: Self-pay | Admitting: Internal Medicine

## 2013-11-27 ENCOUNTER — Encounter: Payer: Self-pay | Admitting: Internal Medicine

## 2019-12-11 ENCOUNTER — Emergency Department (HOSPITAL_BASED_OUTPATIENT_CLINIC_OR_DEPARTMENT_OTHER)
Admission: EM | Admit: 2019-12-11 | Discharge: 2019-12-11 | Disposition: A | Discharge status: Home / Self Care | Payer: BC Managed Care – PPO | Attending: Emergency Medicine | Admitting: Emergency Medicine

## 2019-12-11 ENCOUNTER — Emergency Department (HOSPITAL_BASED_OUTPATIENT_CLINIC_OR_DEPARTMENT_OTHER): Payer: BC Managed Care – PPO

## 2019-12-11 ENCOUNTER — Encounter (HOSPITAL_BASED_OUTPATIENT_CLINIC_OR_DEPARTMENT_OTHER): Payer: Self-pay | Admitting: *Deleted

## 2019-12-11 ENCOUNTER — Other Ambulatory Visit: Payer: Self-pay

## 2019-12-11 DIAGNOSIS — Y92410 Unspecified street and highway as the place of occurrence of the external cause: Secondary | ICD-10-CM | POA: Insufficient documentation

## 2019-12-11 DIAGNOSIS — M542 Cervicalgia: Secondary | ICD-10-CM | POA: Insufficient documentation

## 2019-12-11 DIAGNOSIS — Z7901 Long term (current) use of anticoagulants: Secondary | ICD-10-CM | POA: Insufficient documentation

## 2019-12-11 DIAGNOSIS — Z86711 Personal history of pulmonary embolism: Secondary | ICD-10-CM | POA: Diagnosis not present

## 2019-12-11 DIAGNOSIS — E1169 Type 2 diabetes mellitus with other specified complication: Secondary | ICD-10-CM | POA: Diagnosis not present

## 2019-12-11 DIAGNOSIS — E785 Hyperlipidemia, unspecified: Secondary | ICD-10-CM | POA: Insufficient documentation

## 2019-12-11 DIAGNOSIS — I110 Hypertensive heart disease with heart failure: Secondary | ICD-10-CM | POA: Diagnosis not present

## 2019-12-11 DIAGNOSIS — Z7984 Long term (current) use of oral hypoglycemic drugs: Secondary | ICD-10-CM | POA: Diagnosis not present

## 2019-12-11 DIAGNOSIS — R109 Unspecified abdominal pain: Secondary | ICD-10-CM | POA: Diagnosis not present

## 2019-12-11 DIAGNOSIS — I5032 Chronic diastolic (congestive) heart failure: Secondary | ICD-10-CM | POA: Diagnosis not present

## 2019-12-11 DIAGNOSIS — R519 Headache, unspecified: Secondary | ICD-10-CM

## 2019-12-11 DIAGNOSIS — Z79899 Other long term (current) drug therapy: Secondary | ICD-10-CM | POA: Diagnosis not present

## 2019-12-11 DIAGNOSIS — M7918 Myalgia, other site: Secondary | ICD-10-CM

## 2019-12-11 LAB — COMPREHENSIVE METABOLIC PANEL
ALT: 14 U/L (ref 0–44)
AST: 14 U/L — ABNORMAL LOW (ref 15–41)
Albumin: 3.3 g/dL — ABNORMAL LOW (ref 3.5–5.0)
Alkaline Phosphatase: 116 U/L (ref 38–126)
Anion gap: 8 (ref 5–15)
BUN: 16 mg/dL (ref 6–20)
CO2: 30 mmol/L (ref 22–32)
Calcium: 8.4 mg/dL — ABNORMAL LOW (ref 8.9–10.3)
Chloride: 101 mmol/L (ref 98–111)
Creatinine, Ser: 0.69 mg/dL (ref 0.44–1.00)
GFR, Estimated: >60 mL/min (ref >60)
Glucose, Bld: 97 mg/dL (ref 70–99)
Potassium: 4.1 mmol/L (ref 3.5–5.1)
Sodium: 139 mmol/L (ref 135–145)
Total Bilirubin: 0.5 mg/dL (ref 0.3–1.2)
Total Protein: 7 g/dL (ref 6.5–8.1)

## 2019-12-11 LAB — CBC WITH DIFFERENTIAL/PLATELET
Abs Immature Granulocytes: 0.01 10*3/uL (ref 0.00–0.07)
Basophils Absolute: 0 10*3/uL (ref 0.0–0.1)
Basophils Relative: 0 %
Eosinophils Absolute: 0.1 10*3/uL (ref 0.0–0.5)
Eosinophils Relative: 2 %
HCT: 39.9 % (ref 36.0–46.0)
Hemoglobin: 12.2 g/dL (ref 12.0–15.0)
Immature Granulocytes: 0 %
Lymphocytes Relative: 33 %
Lymphs Abs: 2 10*3/uL (ref 0.7–4.0)
MCH: 26.8 pg (ref 26.0–34.0)
MCHC: 30.6 g/dL (ref 30.0–36.0)
MCV: 87.7 fL (ref 80.0–100.0)
Monocytes Absolute: 0.5 10*3/uL (ref 0.1–1.0)
Monocytes Relative: 8 %
Neutro Abs: 3.4 10*3/uL (ref 1.7–7.7)
Neutrophils Relative %: 57 %
Platelets: 179 10*3/uL (ref 150–400)
RBC: 4.55 MIL/uL (ref 3.87–5.11)
RDW: 15.8 % — ABNORMAL HIGH (ref 11.5–15.5)
WBC: 6.1 10*3/uL (ref 4.0–10.5)
nRBC: 0 % (ref 0.0–0.2)

## 2019-12-11 LAB — PREGNANCY, URINE: Preg Test, Ur: NEGATIVE

## 2019-12-11 LAB — PROTIME-INR
INR: 2.2 — ABNORMAL HIGH (ref 0.8–1.2)
Prothrombin Time: 23.7 seconds — ABNORMAL HIGH (ref 11.4–15.2)

## 2019-12-11 MED ORDER — IOHEXOL 300 MG/ML  SOLN
100.0000 mL | Freq: Once | INTRAMUSCULAR | Status: AC | PRN
  Administered 2019-12-11: 100 mL via INTRAVENOUS
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
## 2019-12-11 NOTE — ED Notes (Signed)
Patient transported to XRAY 
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
## 2019-12-11 NOTE — ED Notes (Signed)
Review D/C papers with pt, pt states understanding, pt denies questions at this time.

## 2019-12-11 NOTE — Discharge Instructions (Signed)
Take Tylenol as needed for pain control. Use muscle creams/patches as needed for pain.  Use ice packs or heating pads if this helps control your pain. You will likely have continued muscle stiffness and soreness over the next couple days.  Follow-up with primary care in 1 week if your symptoms are not improving. Return to the emergency room if you develop vision changes, vomiting, slurred speech, numbness, loss of bowel or bladder control, or any new or worsening symptoms.
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
